# Patient Record
Sex: Female | Born: 1985 | Race: White | Hispanic: No | Marital: Married | Smoking: Never smoker
Health system: Southern US, Community
[De-identification: ages and names within clinical notes are randomized; demographics above are authoritative.]

## PROBLEM LIST (undated history)

## (undated) ENCOUNTER — Inpatient Hospital Stay (HOSPITAL_COMMUNITY): Payer: Self-pay

## (undated) DIAGNOSIS — E119 Type 2 diabetes mellitus without complications: Secondary | ICD-10-CM

## (undated) DIAGNOSIS — R112 Nausea with vomiting, unspecified: Secondary | ICD-10-CM

## (undated) DIAGNOSIS — Z86718 Personal history of other venous thrombosis and embolism: Secondary | ICD-10-CM

## (undated) DIAGNOSIS — R87629 Unspecified abnormal cytological findings in specimens from vagina: Secondary | ICD-10-CM

## (undated) DIAGNOSIS — Z8742 Personal history of other diseases of the female genital tract: Secondary | ICD-10-CM

## (undated) DIAGNOSIS — Z9889 Other specified postprocedural states: Secondary | ICD-10-CM

## (undated) DIAGNOSIS — K219 Gastro-esophageal reflux disease without esophagitis: Secondary | ICD-10-CM

## (undated) HISTORY — PX: COLPOSCOPY: SHX161

## (undated) HISTORY — PX: WISDOM TOOTH EXTRACTION: SHX21

## (undated) HISTORY — DX: Type 2 diabetes mellitus without complications: E11.9

---
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
## 2006-10-04 ENCOUNTER — Other Ambulatory Visit: Admission: RE | Admit: 2006-10-04 | Discharge: 2006-10-04 | Payer: Self-pay | Admitting: Family Medicine

## 2008-01-22 ENCOUNTER — Other Ambulatory Visit: Admission: RE | Admit: 2008-01-22 | Discharge: 2008-01-22 | Payer: Self-pay | Admitting: Family Medicine

## 2014-04-21 ENCOUNTER — Other Ambulatory Visit (HOSPITAL_COMMUNITY): Payer: Self-pay | Admitting: Obstetrics

## 2014-04-21 DIAGNOSIS — N971 Female infertility of tubal origin: Secondary | ICD-10-CM

## 2014-04-26 ENCOUNTER — Ambulatory Visit (HOSPITAL_COMMUNITY): Payer: Self-pay

## 2015-09-28 ENCOUNTER — Encounter (HOSPITAL_BASED_OUTPATIENT_CLINIC_OR_DEPARTMENT_OTHER): Payer: Self-pay

## 2015-09-28 ENCOUNTER — Emergency Department (HOSPITAL_BASED_OUTPATIENT_CLINIC_OR_DEPARTMENT_OTHER): Payer: BLUE CROSS/BLUE SHIELD

## 2015-09-28 ENCOUNTER — Emergency Department (HOSPITAL_BASED_OUTPATIENT_CLINIC_OR_DEPARTMENT_OTHER)
Admission: EM | Admit: 2015-09-28 | Discharge: 2015-09-28 | Disposition: A | Discharge status: Home / Self Care | Payer: BLUE CROSS/BLUE SHIELD | Attending: Emergency Medicine | Admitting: Emergency Medicine

## 2015-09-28 DIAGNOSIS — S8002XA Contusion of left knee, initial encounter: Secondary | ICD-10-CM | POA: Diagnosis not present

## 2015-09-28 DIAGNOSIS — Y998 Other external cause status: Secondary | ICD-10-CM | POA: Insufficient documentation

## 2015-09-28 DIAGNOSIS — W010XXA Fall on same level from slipping, tripping and stumbling without subsequent striking against object, initial encounter: Secondary | ICD-10-CM | POA: Insufficient documentation

## 2015-09-28 DIAGNOSIS — Y9302 Activity, running: Secondary | ICD-10-CM | POA: Insufficient documentation

## 2015-09-28 DIAGNOSIS — Y9289 Other specified places as the place of occurrence of the external cause: Secondary | ICD-10-CM | POA: Insufficient documentation

## 2015-09-28 DIAGNOSIS — S8992XA Unspecified injury of left lower leg, initial encounter: Secondary | ICD-10-CM | POA: Diagnosis present

## 2015-09-28 NOTE — Discharge Instructions (Signed)
Contusion °A contusion is a deep bruise. Contusions are the result of an injury that caused bleeding under the skin. The contusion may turn blue, purple, or yellow. Minor injuries will give you a painless contusion, but more severe contusions may stay painful and swollen for a few weeks.  °CAUSES  °A contusion is usually caused by a blow, trauma, or direct force to an area of the body. °SYMPTOMS  °· Swelling and redness of the injured area. °· Bruising of the injured area. °· Tenderness and soreness of the injured area. °· Pain. °DIAGNOSIS  °The diagnosis can be made by taking a history and physical exam. An X-ray, CT scan, or MRI may be needed to determine if there were any associated injuries, such as fractures. °TREATMENT  °Specific treatment will depend on what area of the body was injured. In general, the best treatment for a contusion is resting, icing, elevating, and applying cold compresses to the injured area. Over-the-counter medicines may also be recommended for pain control. Ask your caregiver what the best treatment is for your contusion. °HOME CARE INSTRUCTIONS  °· Put ice on the injured area. °¨ Put ice in a plastic bag. °¨ Place a towel between your skin and the bag. °¨ Leave the ice on for 15-20 minutes, 3-4 times a day, or as directed by your health care provider. °· Only take over-the-counter or prescription medicines for pain, discomfort, or fever as directed by your caregiver. Your caregiver may recommend avoiding anti-inflammatory medicines (aspirin, ibuprofen, and naproxen) for 48 hours because these medicines may increase bruising. °· Rest the injured area. °· If possible, elevate the injured area to reduce swelling. °SEEK IMMEDIATE MEDICAL CARE IF:  °· You have increased bruising or swelling. °· You have pain that is getting worse. °· Your swelling or pain is not relieved with medicines. °MAKE SURE YOU:  °· Understand these instructions. °· Will watch your condition. °· Will get help right  away if you are not doing well or get worse. °Document Released: 09/26/2005 Document Revised: 12/22/2013 Document Reviewed: 10/22/2011 °ExitCare® Patient Information ©2015 [employer]. This information is not intended to replace advice given to you by your health care provider. Make sure you discuss any questions you have with your health care provider. °Knee Pain °The knee is the complex joint between your thigh and your lower leg. It is made up of bones, tendons, ligaments, and cartilage. The bones that make up the knee are: °· The femur in the thigh. °· The tibia and fibula in the lower leg. °· The patella or kneecap riding in the groove on the lower femur. °CAUSES  °Knee pain is a common complaint with many causes. A few of these causes are: °· Injury, such as: °¨ A ruptured ligament or tendon injury. °¨ Torn cartilage. °· Medical conditions, such as: °¨ Gout °¨ Arthritis °¨ Infections °· Overuse, over training, or overdoing a physical activity. °Knee pain can be minor or severe. Knee pain can accompany debilitating injury. Minor knee problems often respond well to self-care measures or get well on their own. More serious injuries may need medical intervention or even surgery. °SYMPTOMS °The knee is complex. Symptoms of knee problems can vary widely. Some of the problems are: °· Pain with movement and weight bearing. °· Swelling and tenderness. °· Buckling of the knee. °· Inability to straighten or extend your knee. °· Your knee locks and you cannot straighten it. °· Warmth and redness with pain and fever. °· Deformity or dislocation   of the kneecap. °DIAGNOSIS  °Determining what is wrong may be very straight forward such as when there is an injury. It can also be challenging because of the complexity of the knee. Tests to make a diagnosis may include: °· Your caregiver taking a history and doing a physical exam. °· Routine X-rays can be used to rule out other problems. X-rays will not reveal a cartilage tear.  Some injuries of the knee can be diagnosed by: °¨ Arthroscopy a surgical technique by which a small video camera is inserted through tiny incisions on the sides of the knee. This procedure is used to examine and repair internal knee joint problems. Tiny instruments can be used during arthroscopy to repair the torn knee cartilage (meniscus). °¨ Arthrography is a radiology technique. A contrast liquid is directly injected into the knee joint. Internal structures of the knee joint then become visible on X-ray film. °¨ An MRI scan is a non X-ray radiology procedure in which magnetic fields and a computer produce two- or three-dimensional images of the inside of the knee. Cartilage tears are often visible using an MRI scanner. MRI scans have largely replaced arthrography in diagnosing cartilage tears of the knee. °· Blood work. °· Examination of the fluid that helps to lubricate the knee joint (synovial fluid). This is done by taking a sample out using a needle and a syringe. °TREATMENT °The treatment of knee problems depends on the cause. Some of these treatments are: °· Depending on the injury, proper casting, splinting, surgery, or physical therapy care will be needed. °· Give yourself adequate recovery time. Do not overuse your joints. If you begin to get sore during workout routines, back off. Slow down or do fewer repetitions. °· For repetitive activities such as cycling or running, maintain your strength and nutrition. °· Alternate muscle groups. For example, if you are a weight lifter, work the upper body on one day and the lower body the next. °· Either tight or weak muscles do not give the proper support for your knee. Tight or weak muscles do not absorb the stress placed on the knee joint. Keep the muscles surrounding the knee strong. °· Take care of mechanical problems. °¨ If you have flat feet, orthotics or special shoes may help. See your caregiver if you need help. °¨ Arch supports, sometimes with wedges  on the inner or outer aspect of the heel, can help. These can shift pressure away from the side of the knee most bothered by osteoarthritis. °¨ A brace called an "unloader" brace also may be used to help ease the pressure on the most arthritic side of the knee. °· If your caregiver has prescribed crutches, braces, wraps or ice, use as directed. The acronym for this is PRICE. This means protection, rest, ice, compression, and elevation. °· Nonsteroidal anti-inflammatory drugs (NSAIDs), can help relieve pain. But if taken immediately after an injury, they may actually increase swelling. Take NSAIDs with food in your stomach. Stop them if you develop stomach problems. Do not take these if you have a history of ulcers, stomach pain, or bleeding from the bowel. Do not take without your caregiver's approval if you have problems with fluid retention, heart failure, or kidney problems. °· For ongoing knee problems, physical therapy may be helpful. °· Glucosamine and chondroitin are over-the-counter dietary supplements. Both may help relieve the pain of osteoarthritis in the knee. These medicines are different from the usual anti-inflammatory drugs. Glucosamine may decrease the rate of cartilage destruction. °· Injections   of a corticosteroid drug into your knee joint may help reduce the symptoms of an arthritis flare-up. They may provide pain relief that lasts a few months. You may have to wait a few months between injections. The injections do have a small increased risk of infection, water retention, and elevated blood sugar levels. °· Hyaluronic acid injected into damaged joints may ease pain and provide lubrication. These injections may work by reducing inflammation. A series of shots may give relief for as long as 6 months. °· Topical painkillers. Applying certain ointments to your skin may help relieve the pain and stiffness of osteoarthritis. Ask your pharmacist for suggestions. Many over the-counter products are  approved for temporary relief of arthritis pain. °· In some countries, doctors often prescribe topical NSAIDs for relief of chronic conditions such as arthritis and tendinitis. A review of treatment with NSAID creams found that they worked as well as oral medications but without the serious side effects. °PREVENTION °· Maintain a healthy weight. Extra pounds put more strain on your joints. °· Get strong, stay limber. Weak muscles are a common cause of knee injuries. Stretching is important. Include flexibility exercises in your workouts. °· Be smart about exercise. If you have osteoarthritis, chronic knee pain or recurring injuries, you may need to change the way you exercise. This does not mean you have to stop being active. If your knees ache after jogging or playing basketball, consider switching to swimming, water aerobics, or other low-impact activities, at least for a few days a week. Sometimes limiting high-impact activities will provide relief. °· Make sure your shoes fit well. Choose footwear that is right for your sport. °· Protect your knees. Use the proper gear for knee-sensitive activities. Use kneepads when playing volleyball or laying carpet. Buckle your seat belt every time you drive. Most shattered kneecaps occur in car accidents. °· Rest when you are tired. °SEEK MEDICAL CARE IF:  °You have knee pain that is continual and does not seem to be getting better.  °SEEK IMMEDIATE MEDICAL CARE IF:  °Your knee joint feels hot to the touch and you have a high fever. °MAKE SURE YOU:  °· Understand these instructions. °· Will watch your condition. °· Will get help right away if you are not doing well or get worse. °Document Released: 10/14/2007 Document Revised: 03/10/2012 Document Reviewed: 10/14/2007 °ExitCare® Patient Information ©2015 [employer]. This information is not intended to replace advice given to you by your health care provider. Make sure you discuss any questions you have with your health  care provider. ° °

## 2015-09-28 NOTE — ED Provider Notes (Signed)
CSN: [id number]     Arrival date & time 09/28/15  2104 History  By signing my name below, I, Maria Prince, attest that this documentation has been prepared under the direction and in the presence of Arby Barrette, MD. Electronically Signed: Soijett Prince, ED Scribe. 09/28/2015. 10:33 PM.   Chief Complaint  Patient presents with  . Knee Injury      The history is provided by the patient. No language interpreter was used.    Maria Prince is a 29 y.o. female  who presents to the Emergency Department complaining of left knee injury onset PTA. She reports that she was running when she slipped and fell onto hardwood floor and landed directly on her left knee. She reports that she is able to walk but not without pain to the left knee. Pt is having associated symptoms of redness to left knee. She notes that she has tried ibuprofen with no relief of her symptoms. She denies color change, wound, rash, joint swelling, and any other symptoms. She notes that she does have an orthopedist.    History reviewed. No pertinent past medical history. History reviewed. No pertinent past surgical history. No family history on file. Social History  Substance Use Topics  . Smoking status: None  . Smokeless tobacco: None  . Alcohol Use: None   OB History    No data available     Review of Systems  A complete 10 system review of systems was obtained and all systems are negative except as noted in the HPI and PMH.    Allergies  Neomycin and Tetracyclines & related  Home Medications   Prior to Admission medications   Not on File   BP 119/83 mmHg  Pulse 80  Temp(Src) 98.3 F (36.8 C) (Oral)  Resp 16  Ht  (1.778 m)  Wt 157 lb (71.215 kg)  BMI 22.53 kg/m2  SpO2 98%  LMP 09/02/2015 Physical Exam  Constitutional: She is oriented to person, place, and time. She appears well-developed and well-nourished.  HENT:  Head: Normocephalic and atraumatic.  Eyes: EOM are normal.  Pulmonary/Chest:  Effort normal.  Musculoskeletal:  Left knee with mild erythema over her patella. No fluctuance. No gross joint effusion. Pain with direct palpation. Range of motion intact. Patient ambulates with antalgic gait. Lower extremity is neurovascularly intact.  Neurological: She is alert and oriented to person, place, and time.  Psychiatric: She has a normal mood and affect.    ED Course  Procedures (including critical care time) DIAGNOSTIC STUDIES: Oxygen Saturation is 98% on RA, nl by my interpretation.    COORDINATION OF CARE: 10:31 PM Discussed treatment plan with pt at bedside which includes left knee xray, ace wrap, knee brace, crutches, RICE, ibuprofen, and f/u with orthopedics PRN and pt agreed to plan.    Labs Review Labs Reviewed - No data to display  Imaging Review Dg Knee Complete 4 Views Left  09/28/2015   CLINICAL DATA:  Status post fall today with pain in the left knee.  EXAM: LEFT KNEE - COMPLETE 4+ VIEW  COMPARISON:  None.  FINDINGS: There is no evidence of fracture, dislocation, or joint effusion. There is no evidence of arthropathy or other focal bone abnormality. Soft tissues are unremarkable.  IMPRESSION: Negative.   Electronically Signed   By: Sherian Rein M.D.   On: 09/28/2015 21:53   I have personally reviewed and evaluated these images as part of my medical decision-making.   EKG Interpretation None  MDM   Final diagnoses:  Knee contusion, left, initial encounter    No fractures identified on x-ray. Plan will be for immobilization with icing and elevation and ibuprofen for pain. Patient is counseled on follow-up with orthopedics.   Arby Barrette, MD 10/13/15 [phone]

## 2015-09-28 NOTE — ED Notes (Signed)
Patient transported to X-ray 

## 2015-09-28 NOTE — ED Notes (Addendum)
Pt was running and slipped and fell onto hardwood floor and landed on her left knee.  Pt able to ambulate with limping, redness to knee cap.  Pt took ibuprofen prior to arrival.

## 2015-10-16 ENCOUNTER — Encounter (HOSPITAL_BASED_OUTPATIENT_CLINIC_OR_DEPARTMENT_OTHER): Payer: Self-pay | Admitting: *Deleted

## 2015-10-16 ENCOUNTER — Emergency Department (HOSPITAL_BASED_OUTPATIENT_CLINIC_OR_DEPARTMENT_OTHER)
Admission: EM | Admit: 2015-10-16 | Discharge: 2015-10-16 | Disposition: A | Discharge status: Home / Self Care | Payer: BLUE CROSS/BLUE SHIELD | Attending: Emergency Medicine | Admitting: Emergency Medicine

## 2015-10-16 DIAGNOSIS — M5442 Lumbago with sciatica, left side: Secondary | ICD-10-CM

## 2015-10-16 DIAGNOSIS — M5440 Lumbago with sciatica, unspecified side: Secondary | ICD-10-CM | POA: Diagnosis not present

## 2015-10-16 DIAGNOSIS — M545 Low back pain: Secondary | ICD-10-CM | POA: Diagnosis present

## 2015-10-16 DIAGNOSIS — Z79899 Other long term (current) drug therapy: Secondary | ICD-10-CM | POA: Insufficient documentation

## 2015-10-16 DIAGNOSIS — Z86718 Personal history of other venous thrombosis and embolism: Secondary | ICD-10-CM | POA: Insufficient documentation

## 2015-10-16 DIAGNOSIS — Z3202 Encounter for pregnancy test, result negative: Secondary | ICD-10-CM | POA: Insufficient documentation

## 2015-10-16 DIAGNOSIS — M5441 Lumbago with sciatica, right side: Secondary | ICD-10-CM

## 2015-10-16 HISTORY — DX: Personal history of other venous thrombosis and embolism: Z86.718

## 2015-10-16 LAB — URINALYSIS, ROUTINE W REFLEX MICROSCOPIC
BILIRUBIN URINE: NEGATIVE
Glucose, UA: NEGATIVE mg/dL
Hgb urine dipstick: NEGATIVE
KETONES UR: NEGATIVE mg/dL
Leukocytes, UA: NEGATIVE
NITRITE: NEGATIVE
PH: 5.5 (ref 5.0–8.0)
Protein, ur: NEGATIVE mg/dL
Specific Gravity, Urine: 1.003 — ABNORMAL LOW (ref 1.005–1.030)
Urobilinogen, UA: 0.2 mg/dL (ref 0.0–1.0)

## 2015-10-16 LAB — PREGNANCY, URINE: Preg Test, Ur: NEGATIVE

## 2015-10-16 MED ORDER — HYDROCODONE-ACETAMINOPHEN 5-325 MG PO TABS
1.0000 | ORAL_TABLET | Freq: Once | ORAL | Status: AC
  Administered 2015-10-16: 1 via ORAL
  Filled 2015-10-16: qty 1

## 2015-10-16 MED ORDER — HYDROCODONE-ACETAMINOPHEN 5-325 MG PO TABS: ORAL_TABLET | ORAL | Status: DC

## 2015-10-16 NOTE — Discharge Instructions (Signed)
Please follow with your sports medicine doctor for discussion of physical therapy  Take vicodin for breakthrough pain, do not drink alcohol, drive, care for children or do other critical tasks while taking vicodin.  Please follow with your primary care doctor in the next 2 days for a check-up. They must obtain records for further management.   Do not hesitate to return to the Emergency Department for any new, worsening or concerning symptoms.    Sciatica Sciatica is pain, weakness, numbness, or tingling along the path of the sciatic nerve. The nerve starts in the lower back and runs down the back of each leg. The nerve controls the muscles in the lower leg and in the back of the knee, while also providing sensation to the back of the thigh, lower leg, and the sole of your foot. Sciatica is a symptom of another medical condition. For instance, nerve damage or certain conditions, such as a herniated disk or bone spur on the spine, pinch or put pressure on the sciatic nerve. This causes the pain, weakness, or other sensations normally associated with sciatica. Generally, sciatica only affects one side of the body. CAUSES   Herniated or slipped disc.  Degenerative disk disease.  A pain disorder involving the narrow muscle in the buttocks (piriformis syndrome).  Pelvic injury or fracture.  Pregnancy.  Tumor (rare). SYMPTOMS  Symptoms can vary from mild to very severe. The symptoms usually travel from the low back to the buttocks and down the back of the leg. Symptoms can include:  Mild tingling or dull aches in the lower back, leg, or hip.  Numbness in the back of the calf or sole of the foot.  Burning sensations in the lower back, leg, or hip.  Sharp pains in the lower back, leg, or hip.  Leg weakness.  Severe back pain inhibiting movement. These symptoms may get worse with coughing, sneezing, laughing, or prolonged sitting or standing. Also, being overweight may worsen  symptoms. DIAGNOSIS  Your caregiver will perform a physical exam to look for common symptoms of sciatica. He or she may ask you to do certain movements or activities that would trigger sciatic nerve pain. Other tests may be performed to find the cause of the sciatica. These may include:  Blood tests.  X-rays.  Imaging tests, such as an MRI or CT scan. TREATMENT  Treatment is directed at the cause of the sciatic pain. Sometimes, treatment is not necessary and the pain and discomfort goes away on its own. If treatment is needed, your caregiver may suggest:  Over-the-counter medicines to relieve pain.  Prescription medicines, such as anti-inflammatory medicine, muscle relaxants, or narcotics.  Applying heat or ice to the painful area.  Steroid injections to lessen pain, irritation, and inflammation around the nerve.  Reducing activity during periods of pain.  Exercising and stretching to strengthen your abdomen and improve flexibility of your spine. Your caregiver may suggest losing weight if the extra weight makes the back pain worse.  Physical therapy.  Surgery to eliminate what is pressing or pinching the nerve, such as a bone spur or part of a herniated disk. HOME CARE INSTRUCTIONS   Only take over-the-counter or prescription medicines for pain or discomfort as directed by your caregiver.  Apply ice to the affected area for 20 minutes, 3-4 times a day for the first 48-72 hours. Then try heat in the same way.  Exercise, stretch, or perform your usual activities if these do not aggravate your pain.  Attend physical  therapy sessions as directed by your caregiver.  Keep all follow-up appointments as directed by your caregiver.  Do not wear high heels or shoes that do not provide proper support.  Check your mattress to see if it is too soft. A firm mattress may lessen your pain and discomfort. SEEK IMMEDIATE MEDICAL CARE IF:   You lose control of your bowel or bladder  (incontinence).  You have increasing weakness in the lower back, pelvis, buttocks, or legs.  You have redness or swelling of your back.  You have a burning sensation when you urinate.  You have pain that gets worse when you lie down or awakens you at night.  Your pain is worse than you have experienced in the past.  Your pain is lasting longer than 4 weeks.  You are suddenly losing weight without reason. MAKE SURE YOU:  Understand these instructions.  Will watch your condition.  Will get help right away if you are not doing well or get worse.   This information is not intended to replace advice given to you by your health care provider. Make sure you discuss any questions you have with your health care provider.   Document Released: 12/11/2001 Document Revised: 09/07/2015 Document Reviewed: 04/27/2012 Elsevier Interactive Patient Education [employer]

## 2015-10-16 NOTE — ED Notes (Signed)
Pt reports that around 0900 today she bent over and felt a pain in her lower back. Denies radiation to legs, genitourinary symptoms.

## 2015-10-16 NOTE — ED Notes (Signed)
Care assumed at time of d/c, pt not seen by this RN, pt d/c'd by other RN.

## 2015-10-16 NOTE — ED Provider Notes (Signed)
CSN: [id number]     Arrival date & time 10/16/15  1833 History   First MD Initiated Contact with Patient 10/16/15 1836     Chief Complaint  Patient presents with  . Back Pain     (Consider location/radiation/quality/duration/timing/severity/associated sxs/prior Treatment) HPI  Blood pressure 104/72, pulse 79, temperature 98.1 F (36.7 C), temperature source Oral, resp. rate 18, height  (1.778 m), weight 159 lb (72.122 kg), last menstrual period 10/01/2015, SpO2 98 %.  Maria Prince is a 29 y.o. female complaining of moderate, 9 out of 10 bilateral low back pain occurring this morning after patient bent over. States pain is exacerbated by sitting and palpation. Patient took tramadol which was prescribed for a meniscus injury several weeks ago with little relief. Patient denies abdominal pain, dysuria, hematuria, similar prior episodes. Patient has seen a sports medicine doctor for the meniscus tear and states that that has been improving without intervention. Of note, patient is undergoing fertility treatment and needs to avoid any NSAIDs. Denies fever, chills, change in bowel or bladder habits, h/o IDVU or cancer, numbness or weakness.   Past Medical History  Diagnosis Date  . 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 No pertinent past surgical history. No family history on file. Social History  Substance Use Topics  . Smoking status: Never Smoker   . Smokeless tobacco: Never Used  . Alcohol Use: Yes     Comment: occasionally throughout the month   OB History    No data available     Review of Systems  10 systems reviewed and found to be negative, except as noted in the HPI.   Allergies  Neomycin; Tetracyclines & related; Desonide; Fluocinonide; Hydrocortisone butyrate; and Triamcinolone  Home Medications   Prior to Admission medications   Medication Sig Start Date End Date Taking? Authorizing Provider  Prenatal Vit-Fe Fumarate-FA (PRENATAL  MULTIVITAMIN) TABS tablet Take 1 tablet by mouth daily at 12 noon.   Yes Historical Provider, MD  HYDROcodone-acetaminophen (NORCO/VICODIN) 5-325 MG tablet Take 1-2 tablets by mouth every 6 hours as needed for pain and/or cough. 10/16/15   Allora Bains, PA-C   BP 104/72 mmHg  Pulse 79  Temp(Src) 98.1 F (36.7 C) (Oral)  Resp 18  Ht  (1.778 m)  Wt 159 lb (72.122 kg)  BMI 22.81 kg/m2  SpO2 98%  LMP 10/01/2015 (Exact Date) Physical Exam  Constitutional: She appears well-developed and well-nourished.  HENT:  Head: Normocephalic.  Eyes: Conjunctivae are normal.  Neck: Normal range of motion.  Cardiovascular: Normal rate, regular rhythm and intact distal pulses.   Pulmonary/Chest: Effort normal.  Abdominal: Soft. There is no tenderness.  Neurological: She is alert.  No point tenderness to percussion of lumbar spinal processes.  No TTP or paraspinal muscular spasm. Strength is 5 out of 5 to bilateral lower extremities at hip and knee; extensor hallucis longus 5 out of 5. Ankle strength 5 out of 5, no clonus, neurovascularly intact. No saddle anaesthesia. Patellar reflexes are 2+ bilaterally.    Patient ambulates with a coordinated in nonantalgic gait. Straight leg raise is positive at 60 bilaterally.   Psychiatric: She has a normal mood and affect.  Nursing note and vitals reviewed.   ED Course  Procedures (including critical care time) Labs Review Labs Reviewed  URINALYSIS, ROUTINE W REFLEX MICROSCOPIC (NOT AT [facility])  PREGNANCY, URINE    Imaging Review No results found. I have personally reviewed and evaluated these images  and lab results as part of my medical decision-making.   EKG Interpretation None      MDM   Final diagnoses:  Bilateral low back pain with sciatica, sciatica laterality unspecified    Filed Vitals:   10/16/15 1838  BP: 104/72  Pulse: 79  Temp: 98.1 F (36.7 C)  TempSrc: Oral  Resp: 18  Height: 5\' 10"  (1.778 m)  Weight: 159 lb  (72.122 kg)  SpO2: 98%    Medications  HYDROcodone-acetaminophen (NORCO/VICODIN) 5-325 MG per tablet 1 tablet (not administered)    Maria Prince is 29 y.o. female presenting with low back pain onset this morning. I think this may be related to her recent knee injury and a issue with biomechanical alignment. Pain control choices are limited as patient is actively trying to conceive. Taking her off of tramadol will start her on Vicodin. Have advised her to follow closely with her sports medicine physician to discuss physical therapy.  Evaluation does not show pathology that would require ongoing emergent intervention or inpatient treatment. Pt is hemodynamically stable and mentating appropriately. Discussed findings and plan with patient/guardian, who agrees with care plan. All questions answered. Return precautions discussed and outpatient follow up given.   New Prescriptions   HYDROCODONE-ACETAMINOPHEN (NORCO/VICODIN) 5-325 MG TABLET    Take 1-2 tablets by mouth every 6 hours as needed for pain and/or cough.        Wynetta Emeryicole Pritika Alvarez, PA-C 10/16/15 1913  Glynn OctaveStephen Rancour, MD 10/16/15 [phone]

## 2016-04-19 DIAGNOSIS — O09811 Supervision of pregnancy resulting from assisted reproductive technology, first trimester: Secondary | ICD-10-CM | POA: Diagnosis not present

## 2016-04-19 DIAGNOSIS — Z36 Encounter for antenatal screening of mother: Secondary | ICD-10-CM | POA: Diagnosis not present

## 2016-04-19 DIAGNOSIS — Z3491 Encounter for supervision of normal pregnancy, unspecified, first trimester: Secondary | ICD-10-CM | POA: Diagnosis not present

## 2016-04-19 DIAGNOSIS — Z113 Encounter for screening for infections with a predominantly sexual mode of transmission: Secondary | ICD-10-CM | POA: Diagnosis not present

## 2016-04-19 LAB — OB RESULTS CONSOLE ABO/RH: RH TYPE: POSITIVE

## 2016-04-19 LAB — OB RESULTS CONSOLE HIV ANTIBODY (ROUTINE TESTING): HIV: NONREACTIVE

## 2016-04-19 LAB — OB RESULTS CONSOLE RUBELLA ANTIBODY, IGM: RUBELLA: IMMUNE

## 2016-04-19 LAB — OB RESULTS CONSOLE RPR: RPR: NONREACTIVE

## 2016-04-19 LAB — OB RESULTS CONSOLE HEPATITIS B SURFACE ANTIGEN: HEP B S AG: NEGATIVE

## 2016-04-19 LAB — OB RESULTS CONSOLE GC/CHLAMYDIA
CHLAMYDIA, DNA PROBE: NEGATIVE
Gonorrhea: NEGATIVE

## 2016-04-30 DIAGNOSIS — Z36 Encounter for antenatal screening of mother: Secondary | ICD-10-CM | POA: Diagnosis not present

## 2016-05-29 DIAGNOSIS — Z36 Encounter for antenatal screening of mother: Secondary | ICD-10-CM | POA: Diagnosis not present

## 2016-05-29 DIAGNOSIS — Z3A16 16 weeks gestation of pregnancy: Secondary | ICD-10-CM | POA: Diagnosis not present

## 2016-05-29 DIAGNOSIS — O30042 Twin pregnancy, dichorionic/diamniotic, second trimester: Secondary | ICD-10-CM | POA: Diagnosis not present

## 2016-06-18 DIAGNOSIS — O30042 Twin pregnancy, dichorionic/diamniotic, second trimester: Secondary | ICD-10-CM | POA: Diagnosis not present

## 2016-06-18 DIAGNOSIS — Z36 Encounter for antenatal screening of mother: Secondary | ICD-10-CM | POA: Diagnosis not present

## 2016-07-12 DIAGNOSIS — R42 Dizziness and giddiness: Secondary | ICD-10-CM | POA: Diagnosis not present

## 2016-07-12 DIAGNOSIS — O30042 Twin pregnancy, dichorionic/diamniotic, second trimester: Secondary | ICD-10-CM | POA: Diagnosis not present

## 2016-07-12 DIAGNOSIS — Z3A22 22 weeks gestation of pregnancy: Secondary | ICD-10-CM | POA: Diagnosis not present

## 2016-08-09 DIAGNOSIS — Z3A26 26 weeks gestation of pregnancy: Secondary | ICD-10-CM | POA: Diagnosis not present

## 2016-08-09 DIAGNOSIS — O30042 Twin pregnancy, dichorionic/diamniotic, second trimester: Secondary | ICD-10-CM | POA: Diagnosis not present

## 2016-08-09 DIAGNOSIS — Z36 Encounter for antenatal screening of mother: Secondary | ICD-10-CM | POA: Diagnosis not present

## 2016-08-16 DIAGNOSIS — O99810 Abnormal glucose complicating pregnancy: Secondary | ICD-10-CM | POA: Diagnosis not present

## 2016-08-16 DIAGNOSIS — Z3A27 27 weeks gestation of pregnancy: Secondary | ICD-10-CM | POA: Diagnosis not present

## 2016-08-20 ENCOUNTER — Encounter: Payer: BLUE CROSS/BLUE SHIELD | Attending: Obstetrics | Admitting: Skilled Nursing Facility1

## 2016-08-20 DIAGNOSIS — O99810 Abnormal glucose complicating pregnancy: Secondary | ICD-10-CM | POA: Insufficient documentation

## 2016-08-20 DIAGNOSIS — Z713 Dietary counseling and surveillance: Secondary | ICD-10-CM | POA: Insufficient documentation

## 2016-08-20 DIAGNOSIS — O24410 Gestational diabetes mellitus in pregnancy, diet controlled: Secondary | ICD-10-CM

## 2016-08-22 ENCOUNTER — Encounter: Payer: Self-pay | Admitting: Skilled Nursing Facility1

## 2016-08-22 NOTE — Progress Notes (Signed)
  Patient was seen on 08/20/2016 for Gestational Diabetes self-management class at the [facility]. The following learning objectives were met by the patient during this course:   States the definition of Gestational Diabetes  States why dietary management is important in controlling blood glucose  Describes the effects each nutrient has on blood glucose levels  Demonstrates ability to create a balanced meal plan  Demonstrates carbohydrate counting   States when to check blood glucose levels involving a total of 4 separate occurences in a day  Demonstrates proper blood glucose monitoring techniques  States the effect of stress and exercise on blood glucose levels  States the importance of limiting caffeine and abstaining from alcohol and smoking  Demonstrates the knowledge the glucometer provided in class may not be covered by their insurance and to call their insurance provider immediately after class to know which glucometer their insurance provider does cover as well as calling their physician the next day for a prescription to the glucometer their insurance does cover (if the one provided is not) as well as the lancets and strips for that meter.  Blood glucose monitor given: contour next Lot # [id number] Exp: 02/27/2017 Blood glucose reading: 87  Patient instructed to monitor glucose levels: FBS: 60 - <90 1 hour: <140 2 hour: <120  *Patient received handouts:  Nutrition Diabetes and Pregnancy  Carbohydrate Counting List  Patient will be seen for follow-up as needed.

## 2016-08-23 DIAGNOSIS — Z36 Encounter for antenatal screening of mother: Secondary | ICD-10-CM | POA: Diagnosis not present

## 2016-08-23 DIAGNOSIS — Z23 Encounter for immunization: Secondary | ICD-10-CM | POA: Diagnosis not present

## 2016-08-24 ENCOUNTER — Encounter: Payer: Self-pay | Admitting: Skilled Nursing Facility1

## 2016-08-29 ENCOUNTER — Encounter: Payer: Self-pay | Admitting: Obstetrics

## 2016-09-06 DIAGNOSIS — Z3A30 30 weeks gestation of pregnancy: Secondary | ICD-10-CM | POA: Diagnosis not present

## 2016-09-06 DIAGNOSIS — O30043 Twin pregnancy, dichorionic/diamniotic, third trimester: Secondary | ICD-10-CM | POA: Diagnosis not present

## 2016-09-20 DIAGNOSIS — J029 Acute pharyngitis, unspecified: Secondary | ICD-10-CM | POA: Diagnosis not present

## 2016-10-05 DIAGNOSIS — O30043 Twin pregnancy, dichorionic/diamniotic, third trimester: Secondary | ICD-10-CM | POA: Diagnosis not present

## 2016-10-05 DIAGNOSIS — Z3A34 34 weeks gestation of pregnancy: Secondary | ICD-10-CM | POA: Diagnosis not present

## 2016-10-05 DIAGNOSIS — Z23 Encounter for immunization: Secondary | ICD-10-CM | POA: Diagnosis not present

## 2016-10-16 ENCOUNTER — Inpatient Hospital Stay (HOSPITAL_COMMUNITY): Payer: BLUE CROSS/BLUE SHIELD | Admitting: Anesthesiology

## 2016-10-16 ENCOUNTER — Encounter (HOSPITAL_COMMUNITY)
Admission: AD | Disposition: A | Discharge status: Home / Self Care | Payer: Self-pay | Source: Ambulatory Visit | Attending: Obstetrics and Gynecology

## 2016-10-16 ENCOUNTER — Encounter (HOSPITAL_COMMUNITY): Payer: Self-pay

## 2016-10-16 ENCOUNTER — Encounter (HOSPITAL_COMMUNITY): Payer: Self-pay | Admitting: Anesthesiology

## 2016-10-16 ENCOUNTER — Inpatient Hospital Stay (HOSPITAL_COMMUNITY)
Admission: AD | Admit: 2016-10-16 | Discharge: 2016-10-23 | DRG: 765 | Disposition: A | Discharge status: Home / Self Care | Payer: BLUE CROSS/BLUE SHIELD | Source: Ambulatory Visit | Attending: Obstetrics and Gynecology | Admitting: Obstetrics and Gynecology

## 2016-10-16 DIAGNOSIS — O24420 Gestational diabetes mellitus in childbirth, diet controlled: Secondary | ICD-10-CM | POA: Diagnosis present

## 2016-10-16 DIAGNOSIS — O1494 Unspecified pre-eclampsia, complicating childbirth: Secondary | ICD-10-CM | POA: Diagnosis not present

## 2016-10-16 DIAGNOSIS — O1415 Severe pre-eclampsia, complicating the puerperium: Secondary | ICD-10-CM | POA: Diagnosis not present

## 2016-10-16 DIAGNOSIS — Z3A36 36 weeks gestation of pregnancy: Secondary | ICD-10-CM

## 2016-10-16 DIAGNOSIS — O99345 Other mental disorders complicating the puerperium: Secondary | ICD-10-CM | POA: Diagnosis present

## 2016-10-16 DIAGNOSIS — O42013 Preterm premature rupture of membranes, onset of labor within 24 hours of rupture, third trimester: Principal | ICD-10-CM | POA: Diagnosis present

## 2016-10-16 DIAGNOSIS — O1414 Severe pre-eclampsia complicating childbirth: Secondary | ICD-10-CM | POA: Diagnosis not present

## 2016-10-16 DIAGNOSIS — D62 Acute posthemorrhagic anemia: Secondary | ICD-10-CM | POA: Diagnosis not present

## 2016-10-16 DIAGNOSIS — Z23 Encounter for immunization: Secondary | ICD-10-CM | POA: Diagnosis not present

## 2016-10-16 DIAGNOSIS — O30043 Twin pregnancy, dichorionic/diamniotic, third trimester: Secondary | ICD-10-CM | POA: Diagnosis present

## 2016-10-16 DIAGNOSIS — O9081 Anemia of the puerperium: Secondary | ICD-10-CM | POA: Diagnosis not present

## 2016-10-16 DIAGNOSIS — O328XX2 Maternal care for other malpresentation of fetus, fetus 2: Secondary | ICD-10-CM | POA: Diagnosis present

## 2016-10-16 DIAGNOSIS — F419 Anxiety disorder, unspecified: Secondary | ICD-10-CM | POA: Diagnosis present

## 2016-10-16 DIAGNOSIS — O321XX2 Maternal care for breech presentation, fetus 2: Secondary | ICD-10-CM | POA: Diagnosis not present

## 2016-10-16 DIAGNOSIS — O9912 Other diseases of the blood and blood-forming organs and certain disorders involving the immune mechanism complicating childbirth: Secondary | ICD-10-CM | POA: Diagnosis present

## 2016-10-16 DIAGNOSIS — Z6827 Body mass index (BMI) 27.0-27.9, adult: Secondary | ICD-10-CM

## 2016-10-16 DIAGNOSIS — O1495 Unspecified pre-eclampsia, complicating the puerperium: Secondary | ICD-10-CM | POA: Diagnosis not present

## 2016-10-16 DIAGNOSIS — O30049 Twin pregnancy, dichorionic/diamniotic, unspecified trimester: Secondary | ICD-10-CM | POA: Diagnosis not present

## 2016-10-16 DIAGNOSIS — O4290 Premature rupture of membranes, unspecified as to length of time between rupture and onset of labor, unspecified weeks of gestation: Secondary | ICD-10-CM | POA: Diagnosis present

## 2016-10-16 DIAGNOSIS — D696 Thrombocytopenia, unspecified: Secondary | ICD-10-CM | POA: Diagnosis present

## 2016-10-16 LAB — COMPREHENSIVE METABOLIC PANEL
ALBUMIN: 1.9 g/dL — AB (ref 3.5–5.0)
ALT: 14 U/L (ref 14–54)
ALT: 13 U/L — AB (ref 14–54)
ANION GAP: 11 (ref 5–15)
AST: 30 U/L (ref 15–41)
AST: 30 U/L (ref 15–41)
Albumin: 2.5 g/dL — ABNORMAL LOW (ref 3.5–5.0)
Alkaline Phosphatase: 209 U/L — ABNORMAL HIGH (ref 38–126)
Alkaline Phosphatase: 150 U/L — ABNORMAL HIGH (ref 38–126)
Anion gap: 8 (ref 5–15)
BILIRUBIN TOTAL: 0.6 mg/dL (ref 0.3–1.2)
BILIRUBIN TOTAL: 0.4 mg/dL (ref 0.3–1.2)
BUN: 26 mg/dL — AB (ref 6–20)
BUN: 24 mg/dL — AB (ref 6–20)
CHLORIDE: 107 mmol/L (ref 101–111)
CHLORIDE: 107 mmol/L (ref 101–111)
CO2: 17 mmol/L — ABNORMAL LOW (ref 22–32)
CO2: 20 mmol/L — ABNORMAL LOW (ref 22–32)
CREATININE: 0.96 mg/dL (ref 0.44–1.00)
Calcium: 9.2 mg/dL (ref 8.9–10.3)
Calcium: 8.3 mg/dL — ABNORMAL LOW (ref 8.9–10.3)
Creatinine, Ser: 0.91 mg/dL (ref 0.44–1.00)
GFR calc Af Amer: >60 mL/min (ref >60)
GLUCOSE: 94 mg/dL (ref 65–99)
Glucose, Bld: 70 mg/dL (ref 65–99)
POTASSIUM: 4.2 mmol/L (ref 3.5–5.1)
Potassium: 5.1 mmol/L (ref 3.5–5.1)
Sodium: 135 mmol/L (ref 135–145)
Sodium: 135 mmol/L (ref 135–145)
TOTAL PROTEIN: 5.1 g/dL — AB (ref 6.5–8.1)
TOTAL PROTEIN: 4.2 g/dL — AB (ref 6.5–8.1)

## 2016-10-16 LAB — CBC
HEMATOCRIT: 34.4 % — AB (ref 36.0–46.0)
HEMATOCRIT: 26 % — AB (ref 36.0–46.0)
HEMATOCRIT: 22.3 % — AB (ref 36.0–46.0)
HEMOGLOBIN: 7.5 g/dL — AB (ref 12.0–15.0)
Hemoglobin: 11.7 g/dL — ABNORMAL LOW (ref 12.0–15.0)
Hemoglobin: 8.6 g/dL — ABNORMAL LOW (ref 12.0–15.0)
MCH: 29.5 pg (ref 26.0–34.0)
MCH: 29.1 pg (ref 26.0–34.0)
MCH: 29 pg (ref 26.0–34.0)
MCHC: 34 g/dL (ref 30.0–36.0)
MCHC: 33.1 g/dL (ref 30.0–36.0)
MCHC: 33.6 g/dL (ref 30.0–36.0)
MCV: 86.9 fL (ref 78.0–100.0)
MCV: 87.8 fL (ref 78.0–100.0)
MCV: 86.1 fL (ref 78.0–100.0)
PLATELETS: 107 10*3/uL — AB (ref 150–400)
Platelets: 90 10*3/uL — ABNORMAL LOW (ref 150–400)
Platelets: 102 10*3/uL — ABNORMAL LOW (ref 150–400)
RBC: 3.96 MIL/uL (ref 3.87–5.11)
RBC: 2.96 MIL/uL — AB (ref 3.87–5.11)
RBC: 2.59 MIL/uL — ABNORMAL LOW (ref 3.87–5.11)
RDW: 14.6 % (ref 11.5–15.5)
RDW: 14.3 % (ref 11.5–15.5)
RDW: 14.7 % (ref 11.5–15.5)
WBC: 9 10*3/uL (ref 4.0–10.5)
WBC: 16.9 10*3/uL — AB (ref 4.0–10.5)
WBC: 14.9 10*3/uL — ABNORMAL HIGH (ref 4.0–10.5)

## 2016-10-16 LAB — PREPARE RBC (CROSSMATCH)

## 2016-10-16 LAB — FIBRINOGEN: Fibrinogen: 249 mg/dL (ref 210–475)

## 2016-10-16 LAB — RPR: RPR: NONREACTIVE

## 2016-10-16 LAB — PROTIME-INR
INR: 1.16
PROTHROMBIN TIME: 14.8 s (ref 11.4–15.2)

## 2016-10-16 LAB — LACTATE DEHYDROGENASE: LDH: 183 U/L (ref 98–192)

## 2016-10-16 LAB — PROTEIN / CREATININE RATIO, URINE
CREATININE, URINE: 109 mg/dL
PROTEIN CREATININE RATIO: 1.61 mg/mg{creat} — AB (ref 0.00–0.15)
TOTAL PROTEIN, URINE: 176 mg/dL

## 2016-10-16 LAB — MAGNESIUM: Magnesium: 5.1 mg/dL — ABNORMAL HIGH (ref 1.7–2.4)

## 2016-10-16 LAB — OB RESULTS CONSOLE GBS: STREP GROUP B AG: NEGATIVE

## 2016-10-16 LAB — APTT: APTT: 31 s (ref 24–36)

## 2016-10-16 LAB — ABO/RH: ABO/RH(D): O POS

## 2016-10-16 LAB — GROUP B STREP BY PCR: GROUP B STREP BY PCR: NEGATIVE

## 2016-10-16 SURGERY — Surgical Case
Anesthesia: Regional

## 2016-10-16 MED ORDER — MORPHINE SULFATE (PF) 0.5 MG/ML IJ SOLN
INTRAMUSCULAR | Status: DC | PRN
  Administered 2016-10-16: 3 mg via EPIDURAL

## 2016-10-16 MED ORDER — FENTANYL CITRATE (PF) 100 MCG/2ML IJ SOLN: 100.0000 ug | INTRAMUSCULAR | Status: DC | PRN

## 2016-10-16 MED ORDER — MENTHOL 3 MG MT LOZG: 1.0000 | LOZENGE | OROMUCOSAL | Status: DC | PRN

## 2016-10-16 MED ORDER — PHENYLEPHRINE HCL 10 MG/ML IJ SOLN
INTRAMUSCULAR | Status: DC | PRN
  Administered 2016-10-16: 80 ug via INTRAVENOUS
  Administered 2016-10-16: 40 ug via INTRAVENOUS

## 2016-10-16 MED ORDER — NALBUPHINE HCL 10 MG/ML IJ SOLN: 5.0000 mg | Freq: Once | INTRAMUSCULAR | Status: DC | PRN

## 2016-10-16 MED ORDER — ONDANSETRON HCL 4 MG/2ML IJ SOLN
4.0000 mg | Freq: Once | INTRAMUSCULAR | Status: AC
  Administered 2016-10-16: 4 mg via INTRAVENOUS
  Filled 2016-10-16: qty 2

## 2016-10-16 MED ORDER — DIBUCAINE 1 % RE OINT: 1.0000 "application " | TOPICAL_OINTMENT | RECTAL | Status: DC | PRN

## 2016-10-16 MED ORDER — ACETAMINOPHEN 325 MG PO TABS
650.0000 mg | ORAL_TABLET | ORAL | Status: DC | PRN
  Administered 2016-10-18: 650 mg via ORAL
  Filled 2016-10-16: qty 2

## 2016-10-16 MED ORDER — ONDANSETRON HCL 4 MG/2ML IJ SOLN: 4.0000 mg | Freq: Four times a day (QID) | INTRAMUSCULAR | Status: DC | PRN

## 2016-10-16 MED ORDER — OXYCODONE HCL 5 MG PO TABS
5.0000 mg | ORAL_TABLET | ORAL | Status: DC | PRN
  Administered 2016-10-17 – 2016-10-23 (×11): 5 mg via ORAL
  Filled 2016-10-16 (×12): qty 1

## 2016-10-16 MED ORDER — DIPHENHYDRAMINE HCL 50 MG/ML IJ SOLN: 12.5000 mg | INTRAMUSCULAR | Status: DC | PRN

## 2016-10-16 MED ORDER — FENTANYL 2.5 MCG/ML BUPIVACAINE 1/10 % EPIDURAL INFUSION (WH - ANES)
14.0000 mL/h | INTRAMUSCULAR | Status: DC | PRN
  Administered 2016-10-16: 14 mL/h via EPIDURAL
  Filled 2016-10-16: qty 125

## 2016-10-16 MED ORDER — MEPERIDINE HCL 25 MG/ML IJ SOLN
INTRAMUSCULAR | Status: DC | PRN
  Administered 2016-10-16 (×2): 12.5 mg via INTRAVENOUS

## 2016-10-16 MED ORDER — FENTANYL CITRATE (PF) 100 MCG/2ML IJ SOLN
INTRAMUSCULAR | Status: AC
  Filled 2016-10-16: qty 2

## 2016-10-16 MED ORDER — OXYTOCIN 10 UNIT/ML IJ SOLN
INTRAMUSCULAR | Status: DC | PRN
  Administered 2016-10-16: 40 [IU] via INTRAVENOUS

## 2016-10-16 MED ORDER — BUPIVACAINE HCL (PF) 0.25 % IJ SOLN
INTRAMUSCULAR | Status: AC
  Filled 2016-10-16: qty 30

## 2016-10-16 MED ORDER — TETANUS-DIPHTH-ACELL PERTUSSIS 5-2.5-18.5 LF-MCG/0.5 IM SUSP: 0.5000 mL | Freq: Once | INTRAMUSCULAR | Status: DC

## 2016-10-16 MED ORDER — WITCH HAZEL-GLYCERIN EX PADS: 1.0000 "application " | MEDICATED_PAD | CUTANEOUS | Status: DC | PRN

## 2016-10-16 MED ORDER — LACTATED RINGERS IV SOLN: 500.0000 mL | INTRAVENOUS | Status: DC | PRN

## 2016-10-16 MED ORDER — LACTATED RINGERS IV SOLN
INTRAVENOUS | Status: DC | PRN
  Administered 2016-10-16: 11:00:00 via INTRAVENOUS

## 2016-10-16 MED ORDER — SIMETHICONE 80 MG PO CHEW
80.0000 mg | CHEWABLE_TABLET | ORAL | Status: DC | PRN
  Administered 2016-10-22: 80 mg via ORAL
  Filled 2016-10-16: qty 1

## 2016-10-16 MED ORDER — METHYLERGONOVINE MALEATE 0.2 MG/ML IJ SOLN: 0.2000 mg | INTRAMUSCULAR | Status: DC | PRN

## 2016-10-16 MED ORDER — SENNOSIDES-DOCUSATE SODIUM 8.6-50 MG PO TABS
2.0000 | ORAL_TABLET | ORAL | Status: DC
Start: 2016-10-17 — End: 2016-10-23
  Administered 2016-10-17 – 2016-10-22 (×7): 2 via ORAL
  Filled 2016-10-16 (×6): qty 2

## 2016-10-16 MED ORDER — SODIUM BICARBONATE 8.4 % IV SOLN
INTRAVENOUS | Status: DC | PRN
  Administered 2016-10-16 (×3): 5 mL via EPIDURAL

## 2016-10-16 MED ORDER — LACTATED RINGERS IV SOLN: INTRAVENOUS | Status: DC

## 2016-10-16 MED ORDER — PRENATAL MULTIVITAMIN CH
1.0000 | ORAL_TABLET | Freq: Every day | ORAL | Status: DC
  Administered 2016-10-17 – 2016-10-23 (×7): 1 via ORAL
  Filled 2016-10-16 (×7): qty 1

## 2016-10-16 MED ORDER — DIPHENHYDRAMINE HCL 25 MG PO CAPS: 25.0000 mg | ORAL_CAPSULE | Freq: Four times a day (QID) | ORAL | Status: DC | PRN

## 2016-10-16 MED ORDER — METOCLOPRAMIDE HCL 5 MG/ML IJ SOLN: 10.0000 mg | Freq: Once | INTRAMUSCULAR | Status: DC | PRN

## 2016-10-16 MED ORDER — MAGNESIUM SULFATE BOLUS VIA INFUSION
4.0000 g | Freq: Once | INTRAVENOUS | Status: AC
  Administered 2016-10-16: 4 g via INTRAVENOUS
  Filled 2016-10-16: qty 500

## 2016-10-16 MED ORDER — LIDOCAINE-EPINEPHRINE (PF) 2 %-1:200000 IJ SOLN
INTRAMUSCULAR | Status: AC
  Filled 2016-10-16: qty 20

## 2016-10-16 MED ORDER — NALBUPHINE HCL 10 MG/ML IJ SOLN: 5.0000 mg | INTRAMUSCULAR | Status: DC | PRN

## 2016-10-16 MED ORDER — SODIUM CHLORIDE 0.9 % IV SOLN
Freq: Once | INTRAVENOUS | Status: AC
  Administered 2016-10-16: 21:00:00 via INTRAVENOUS

## 2016-10-16 MED ORDER — LACTATED RINGERS IV SOLN
INTRAVENOUS | Status: DC | PRN
  Administered 2016-10-16 (×4): via INTRAVENOUS

## 2016-10-16 MED ORDER — ONDANSETRON HCL 4 MG/2ML IJ SOLN
INTRAMUSCULAR | Status: DC | PRN
  Administered 2016-10-16: 4 mg via INTRAVENOUS

## 2016-10-16 MED ORDER — KETOROLAC TROMETHAMINE 30 MG/ML IJ SOLN: 30.0000 mg | Freq: Four times a day (QID) | INTRAMUSCULAR | Status: AC | PRN

## 2016-10-16 MED ORDER — DEXAMETHASONE SODIUM PHOSPHATE 4 MG/ML IJ SOLN
INTRAMUSCULAR | Status: AC
  Filled 2016-10-16: qty 1

## 2016-10-16 MED ORDER — BUPIVACAINE HCL (PF) 0.25 % IJ SOLN
INTRAMUSCULAR | Status: DC | PRN
  Administered 2016-10-16: 30 mL

## 2016-10-16 MED ORDER — SOD CITRATE-CITRIC ACID 500-334 MG/5ML PO SOLN
ORAL | Status: AC
  Administered 2016-10-16: 30 mL
  Filled 2016-10-16: qty 15

## 2016-10-16 MED ORDER — DEXAMETHASONE SODIUM PHOSPHATE 4 MG/ML IJ SOLN
INTRAMUSCULAR | Status: DC | PRN
  Administered 2016-10-16: 4 mg via INTRAVENOUS

## 2016-10-16 MED ORDER — CEFAZOLIN SODIUM-DEXTROSE 2-4 GM/100ML-% IV SOLN
INTRAVENOUS | Status: AC
  Filled 2016-10-16: qty 100

## 2016-10-16 MED ORDER — ACETAMINOPHEN 325 MG PO TABS
650.0000 mg | ORAL_TABLET | Freq: Once | ORAL | Status: AC
  Administered 2016-10-16: 650 mg via ORAL
  Filled 2016-10-16: qty 2

## 2016-10-16 MED ORDER — ONDANSETRON HCL 4 MG/2ML IJ SOLN
INTRAMUSCULAR | Status: AC
  Filled 2016-10-16: qty 2

## 2016-10-16 MED ORDER — PHENYLEPHRINE 40 MCG/ML (10ML) SYRINGE FOR IV PUSH (FOR BLOOD PRESSURE SUPPORT)
PREFILLED_SYRINGE | INTRAVENOUS | Status: AC
  Filled 2016-10-16: qty 10

## 2016-10-16 MED ORDER — IBUPROFEN 600 MG PO TABS
600.0000 mg | ORAL_TABLET | Freq: Four times a day (QID) | ORAL | Status: DC
  Administered 2016-10-17 – 2016-10-23 (×20): 600 mg via ORAL
  Filled 2016-10-16 (×20): qty 1

## 2016-10-16 MED ORDER — SODIUM CHLORIDE 0.9% FLUSH: 3.0000 mL | INTRAVENOUS | Status: DC | PRN

## 2016-10-16 MED ORDER — MEPERIDINE HCL 25 MG/ML IJ SOLN
INTRAMUSCULAR | Status: AC
  Filled 2016-10-16: qty 1

## 2016-10-16 MED ORDER — ONDANSETRON HCL 4 MG/2ML IJ SOLN: 4.0000 mg | Freq: Three times a day (TID) | INTRAMUSCULAR | Status: DC | PRN

## 2016-10-16 MED ORDER — ZOLPIDEM TARTRATE 5 MG PO TABS: 5.0000 mg | ORAL_TABLET | Freq: Every evening | ORAL | Status: DC | PRN

## 2016-10-16 MED ORDER — OXYCODONE-ACETAMINOPHEN 5-325 MG PO TABS: 2.0000 | ORAL_TABLET | ORAL | Status: DC | PRN

## 2016-10-16 MED ORDER — MORPHINE SULFATE (PF) 0.5 MG/ML IJ SOLN
INTRAMUSCULAR | Status: AC
  Filled 2016-10-16: qty 10

## 2016-10-16 MED ORDER — DIPHENHYDRAMINE HCL 50 MG/ML IJ SOLN
25.0000 mg | Freq: Once | INTRAMUSCULAR | Status: AC
  Administered 2016-10-16: 25 mg via INTRAVENOUS
  Filled 2016-10-16: qty 1

## 2016-10-16 MED ORDER — LIDOCAINE HCL (PF) 1 % IJ SOLN
INTRAMUSCULAR | Status: DC | PRN
  Administered 2016-10-16 (×2): 5 mL

## 2016-10-16 MED ORDER — EPHEDRINE 5 MG/ML INJ: 10.0000 mg | INTRAVENOUS | Status: DC | PRN

## 2016-10-16 MED ORDER — PHENYLEPHRINE 40 MCG/ML (10ML) SYRINGE FOR IV PUSH (FOR BLOOD PRESSURE SUPPORT)
80.0000 ug | PREFILLED_SYRINGE | INTRAVENOUS | Status: DC | PRN
  Filled 2016-10-16: qty 10

## 2016-10-16 MED ORDER — FENTANYL CITRATE (PF) 100 MCG/2ML IJ SOLN
25.0000 ug | INTRAMUSCULAR | Status: DC | PRN
  Administered 2016-10-16: 50 ug via INTRAVENOUS

## 2016-10-16 MED ORDER — LACTATED RINGERS IV SOLN: 500.0000 mL | Freq: Once | INTRAVENOUS | Status: DC

## 2016-10-16 MED ORDER — LACTATED RINGERS IV BOLUS (SEPSIS)
500.0000 mL | Freq: Once | INTRAVENOUS | Status: AC
  Administered 2016-10-16: 500 mL via INTRAVENOUS

## 2016-10-16 MED ORDER — METHYLERGONOVINE MALEATE 0.2 MG PO TABS: 0.2000 mg | ORAL_TABLET | ORAL | Status: DC | PRN

## 2016-10-16 MED ORDER — DIPHENHYDRAMINE HCL 25 MG PO CAPS
25.0000 mg | ORAL_CAPSULE | ORAL | Status: DC | PRN
  Filled 2016-10-16: qty 1

## 2016-10-16 MED ORDER — MAGNESIUM SULFATE 50 % IJ SOLN
2.0000 g/h | INTRAVENOUS | Status: DC
  Administered 2016-10-17: 2 g/h via INTRAVENOUS
  Filled 2016-10-16 (×2): qty 80

## 2016-10-16 MED ORDER — OXYCODONE-ACETAMINOPHEN 5-325 MG PO TABS
1.0000 | ORAL_TABLET | ORAL | Status: DC | PRN
  Administered 2016-10-16: 2 via ORAL
  Filled 2016-10-16: qty 2

## 2016-10-16 MED ORDER — OXYCODONE-ACETAMINOPHEN 5-325 MG PO TABS: 1.0000 | ORAL_TABLET | ORAL | Status: DC | PRN

## 2016-10-16 MED ORDER — OXYTOCIN 10 UNIT/ML IJ SOLN
INTRAMUSCULAR | Status: AC
  Filled 2016-10-16: qty 4

## 2016-10-16 MED ORDER — SCOPOLAMINE 1 MG/3DAYS TD PT72
MEDICATED_PATCH | TRANSDERMAL | Status: DC | PRN
  Administered 2016-10-16: 1 via TRANSDERMAL

## 2016-10-16 MED ORDER — SIMETHICONE 80 MG PO CHEW
80.0000 mg | CHEWABLE_TABLET | ORAL | Status: DC
Start: 2016-10-17 — End: 2016-10-23
  Administered 2016-10-17 – 2016-10-22 (×7): 80 mg via ORAL
  Filled 2016-10-16 (×6): qty 1

## 2016-10-16 MED ORDER — FLEET ENEMA 7-19 GM/118ML RE ENEM: 1.0000 | ENEMA | RECTAL | Status: DC | PRN

## 2016-10-16 MED ORDER — COCONUT OIL OIL
1.0000 | TOPICAL_OIL | Status: DC | PRN
Start: 2016-10-16 — End: 2016-10-23
  Administered 2016-10-23: 1 via TOPICAL
  Filled 2016-10-16: qty 120

## 2016-10-16 MED ORDER — CEFAZOLIN SODIUM-DEXTROSE 2-4 GM/100ML-% IV SOLN
2.0000 g | Freq: Once | INTRAVENOUS | Status: AC
  Administered 2016-10-16 (×2): 2 g via INTRAVENOUS
  Filled 2016-10-16: qty 100

## 2016-10-16 MED ORDER — MEPERIDINE HCL 25 MG/ML IJ SOLN
6.2500 mg | INTRAMUSCULAR | Status: DC | PRN
  Administered 2016-10-16: 12.5 mg via INTRAVENOUS

## 2016-10-16 MED ORDER — OXYTOCIN BOLUS FROM INFUSION: 500.0000 mL | Freq: Once | INTRAVENOUS | Status: DC

## 2016-10-16 MED ORDER — OXYTOCIN 40 UNITS IN LACTATED RINGERS INFUSION - SIMPLE MED: 2.5000 [IU]/h | INTRAVENOUS | Status: DC

## 2016-10-16 MED ORDER — OXYTOCIN 40 UNITS IN LACTATED RINGERS INFUSION - SIMPLE MED: 2.5000 [IU]/h | INTRAVENOUS | Status: AC

## 2016-10-16 MED ORDER — ACETAMINOPHEN 325 MG PO TABS: 650.0000 mg | ORAL_TABLET | ORAL | Status: DC | PRN

## 2016-10-16 MED ORDER — SIMETHICONE 80 MG PO CHEW
80.0000 mg | CHEWABLE_TABLET | Freq: Three times a day (TID) | ORAL | Status: DC
  Administered 2016-10-17 – 2016-10-23 (×17): 80 mg via ORAL
  Filled 2016-10-16 (×17): qty 1

## 2016-10-16 MED ORDER — PHENYLEPHRINE 40 MCG/ML (10ML) SYRINGE FOR IV PUSH (FOR BLOOD PRESSURE SUPPORT): 80.0000 ug | PREFILLED_SYRINGE | INTRAVENOUS | Status: DC | PRN

## 2016-10-16 MED ORDER — LACTATED RINGERS IV SOLN
INTRAVENOUS | Status: DC
  Administered 2016-10-17 (×3): via INTRAVENOUS

## 2016-10-16 MED ORDER — SCOPOLAMINE 1 MG/3DAYS TD PT72: 1.0000 | MEDICATED_PATCH | Freq: Once | TRANSDERMAL | Status: DC

## 2016-10-16 MED ORDER — MEPERIDINE HCL 25 MG/ML IJ SOLN: 6.2500 mg | INTRAMUSCULAR | Status: DC | PRN

## 2016-10-16 MED ORDER — SODIUM CHLORIDE 0.9 % IJ SOLN
INTRAMUSCULAR | Status: AC
  Filled 2016-10-16: qty 20

## 2016-10-16 MED ORDER — NALOXONE HCL 2 MG/2ML IJ SOSY
1.0000 ug/kg/h | PREFILLED_SYRINGE | INTRAVENOUS | Status: DC | PRN
  Filled 2016-10-16: qty 2

## 2016-10-16 MED ORDER — LIDOCAINE HCL (PF) 1 % IJ SOLN: 30.0000 mL | INTRAMUSCULAR | Status: DC | PRN

## 2016-10-16 MED ORDER — SCOPOLAMINE 1 MG/3DAYS TD PT72
MEDICATED_PATCH | TRANSDERMAL | Status: AC
  Filled 2016-10-16: qty 1

## 2016-10-16 MED ORDER — NALOXONE HCL 0.4 MG/ML IJ SOLN: 0.4000 mg | INTRAMUSCULAR | Status: DC | PRN

## 2016-10-16 SURGICAL SUPPLY — 40 items
APL SKNCLS STERI-STRIP NONHPOA (GAUZE/BANDAGES/DRESSINGS) ×1
BENZOIN TINCTURE PRP APPL 2/3 (GAUZE/BANDAGES/DRESSINGS) ×1 IMPLANT
CHLORAPREP W/TINT 26ML (MISCELLANEOUS) ×2 IMPLANT
CLAMP CORD UMBIL (MISCELLANEOUS) IMPLANT
CLOTH BEACON ORANGE TIMEOUT ST (SAFETY) ×2 IMPLANT
CONTAINER PREFILL 10% NBF 15ML (MISCELLANEOUS) IMPLANT
DRSG OPSITE POSTOP 4X10 (GAUZE/BANDAGES/DRESSINGS) ×2 IMPLANT
DRSG PAD ABDOMINAL 8X10 ST (GAUZE/BANDAGES/DRESSINGS) ×1 IMPLANT
ELECT REM PT RETURN 9FT ADLT (ELECTROSURGICAL) ×2
ELECTRODE REM PT RTRN 9FT ADLT (ELECTROSURGICAL) ×1 IMPLANT
EXTRACTOR VACUUM M CUP 4 TUBE (SUCTIONS) IMPLANT
GLOVE BIO SURGEON STRL SZ7.5 (GLOVE) ×2 IMPLANT
GLOVE BIOGEL PI IND STRL 7.0 (GLOVE) ×1 IMPLANT
GLOVE BIOGEL PI INDICATOR 7.0 (GLOVE) ×1
GOWN STRL REUS W/TWL LRG LVL3 (GOWN DISPOSABLE) ×4 IMPLANT
KIT ABG SYR 3ML LUER SLIP (SYRINGE) IMPLANT
NDL HYPO 25X5/8 SAFETYGLIDE (NEEDLE) IMPLANT
NDL SPNL 20GX3.5 QUINCKE YW (NEEDLE) IMPLANT
NEEDLE HYPO 22GX1.5 SAFETY (NEEDLE) ×2 IMPLANT
NEEDLE HYPO 25X5/8 SAFETYGLIDE (NEEDLE) IMPLANT
NEEDLE SPNL 20GX3.5 QUINCKE YW (NEEDLE) IMPLANT
NS IRRIG 1000ML POUR BTL (IV SOLUTION) ×2 IMPLANT
PACK C SECTION WH (CUSTOM PROCEDURE TRAY) ×2 IMPLANT
PENCIL SMOKE EVAC W/HOLSTER (ELECTROSURGICAL) ×2 IMPLANT
SPONGE GAUZE 4X4 12PLY (GAUZE/BANDAGES/DRESSINGS) ×1 IMPLANT
STRIP CLOSURE SKIN 1/2X4 (GAUZE/BANDAGES/DRESSINGS) ×1 IMPLANT
SUT MNCRL 0 VIOLET CTX 36 (SUTURE) ×2 IMPLANT
SUT MNCRL AB 3-0 PS2 27 (SUTURE) IMPLANT
SUT MON AB 2-0 CT1 27 (SUTURE) ×2 IMPLANT
SUT MON AB-0 CT1 36 (SUTURE) ×4 IMPLANT
SUT MONOCRYL 0 CTX 36 (SUTURE) ×2
SUT PLAIN 0 NONE (SUTURE) IMPLANT
SUT PLAIN 2 0 (SUTURE)
SUT PLAIN 2 0 XLH (SUTURE) ×1 IMPLANT
SUT PLAIN ABS 2-0 CT1 27XMFL (SUTURE) IMPLANT
SYR 20CC LL (SYRINGE) IMPLANT
SYR CONTROL 10ML LL (SYRINGE) ×2 IMPLANT
TAPE HYPAFIX 6X30 (GAUZE/BANDAGES/DRESSINGS) ×1 IMPLANT
TOWEL OR 17X24 6PK STRL BLUE (TOWEL DISPOSABLE) ×2 IMPLANT
TRAY FOLEY CATH SILVER 14FR (SET/KITS/TRAYS/PACK) ×2 IMPLANT

## 2016-10-16 NOTE — MAU Note (Signed)
Pt presents stating her water broke at 0600 with copious amounts of bleeding. Reports contractions every 3 minutes. Denies bleeding.

## 2016-10-16 NOTE — H&P (Addendum)
Maria Prince is a 30 y.o. female presenting for SROM at 0600. No headaches or visual changes. Reports all BS wnl.   OB History    Gravida Para Term Preterm AB Living   1             SAB TAB Ectopic Multiple Live Births                 Past Medical History:  Diagnosis Date  . Diabetes mellitus without complication (HCC)    gestational  . 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 No pertinent surgical history. Family History: family history includes Hyperlipidemia in her other. Social History:  reports that she has never smoked. She has never used smokeless tobacco. She reports that she drinks alcohol. She reports that she does not use drugs.     Maternal Diabetes: Yes:  Diabetes Type:  Diet controlled Genetic Screening: Normal Maternal Ultrasounds/Referrals: Normal Fetal Ultrasounds or other Referrals:  None Maternal Substance Abuse:  No Significant Maternal Medications:  None Significant Maternal Lab Results:  None Other Comments:  None  Review of Systems  Constitutional: Negative.   HENT: Negative.   Eyes: Negative.   All other systems reviewed and are negative.  Maternal Medical History:  Reason for admission: Rupture of membranes.   Contractions: Onset was less than 1 hour ago.   Frequency: irregular.   Perceived severity is moderate.    Fetal activity: Perceived fetal activity is normal.   Last perceived fetal movement was within the past hour.    Prenatal complications: no prenatal complications Prenatal Complications - Diabetes: gestational.    Dilation: 2 Effacement (%): 90 Station: -1 Exam by:: Ginger Morris rn Blood pressure (!) 144/96, pulse 76, temperature 97.9 F (36.6 C), temperature source Oral, resp. rate 16, SpO2 95 %. Maternal Exam:  Uterine Assessment: Contraction strength is moderate.  Contraction frequency is irregular.   Abdomen: Patient reports no abdominal tenderness. Fetal presentation: vertex  Introitus: Normal  vulva. Normal vagina.  Ferning test: positive.  Nitrazine test: positive. Amniotic fluid character: clear.     Physical Exam  Nursing note and vitals reviewed. Constitutional: She is oriented to person, place, and time. She appears well-developed and well-nourished.  HENT:  Head: Normocephalic and atraumatic.  Neck: Normal range of motion. Neck supple.  Cardiovascular: Normal rate and regular rhythm.   Respiratory: Effort normal and breath sounds normal.  GI: Soft. Bowel sounds are normal.  Genitourinary: Vagina normal and uterus normal.  Musculoskeletal: Normal range of motion.  Neurological: She is alert and oriented to person, place, and time. She has normal reflexes.  Skin: Skin is warm and dry.  Psychiatric: She has a normal mood and affect.    Prenatal labs: ABO, Rh:   Antibody:   Rubella:   RPR:    HBsAg:    HIV:    GBS:     Assessment/Plan: 36 week DIDI twin gestation SROM twin A GDM- well controlled by report GBS unknown- rapid strep pending. Will give Ampicillin now. Gestational HTN vs PEC- Urine PC ratio pending. Labs noted. LFTs nl. Plts 107K. VTX/Transverse presentation- options discussed. Will not offer breech extraction of twin B. Will proceed with epidural now. Pt still considering delivery options.    Maria Prince 10/16/2016, 8:46 AM

## 2016-10-16 NOTE — Anesthesia Procedure Notes (Signed)
Epidural Patient location during procedure: OB  Staffing Anesthesiologist: Bettyjean Stefanski Performed: anesthesiologist   Preanesthetic Checklist Completed: patient identified, site marked, surgical consent, pre-op evaluation, timeout performed, IV checked, risks and benefits discussed and monitors and equipment checked  Epidural Patient position: sitting Prep: DuraPrep Patient monitoring: heart rate, continuous pulse ox and blood pressure Approach: right paramedian Location: L3-L4 Injection technique: LOR saline  Needle:  Needle type: Tuohy  Needle gauge: 17 G Needle length: 9 cm and 9 Needle insertion depth: 6 cm Catheter type: closed end flexible Catheter size: 20 Guage Catheter at skin depth: 10 cm Test dose: negative  Assessment Events: blood not aspirated, injection not painful, no injection resistance, negative IV test and no paresthesia  Additional Notes Patient identified. Risks/Benefits/Options discussed with patient including but not limited to bleeding, infection, nerve damage, paralysis, failed block, incomplete pain control, headache, blood pressure changes, nausea, vomiting, reactions to medication both or allergic, itching and postpartum back pain. Confirmed with bedside nurse the patient's most recent platelet count. Confirmed with patient that they are not currently taking any anticoagulation, have any bleeding history or any family history of bleeding disorders. Patient expressed understanding and wished to proceed. All questions were answered. Sterile technique was used throughout the entire procedure. Please see nursing notes for vital signs. Test dose was given through epidural needle and negative prior to continuing to dose epidural or start infusion. Warning signs of high block given to the patient including shortness of breath, tingling/numbness in hands, complete motor block, or any concerning symptoms with instructions to call for help. Patient was given  instructions on fall risk and not to get out of bed. All questions and concerns addressed with instructions to call with any issues.     

## 2016-10-16 NOTE — Lactation Note (Signed)
This note was copied from a baby's chart. Lactation Consultation Note  Patient Name: Merideth AbbeyGirlA Betania Chrostowski Today's Date: 10/16/2016 Reason for consult: Initial assessment;NICU baby;Multiple gestation  NICU twins, 7 hours old. Mom has already pumped once with no colostrum present. Discussed progression of milk coming to volume. Enc mom to pump for 15 minutes every 2-3 hours for a total of 8 times/24 hours. Assisted mom to use DEBP again, and refitted mom with #21 flanges. Mom has labels, discussed how to apply to colostrum containers for twins. Mom given NICU booklet and LC brochure with review. Enc offering STS and nuzzling/latching at the breast as she and babies are able.  Maternal Data Has patient been taught Hand Expression?: Yes Does the patient have breastfeeding experience prior to this delivery?: No  Feeding    LATCH Score/Interventions                      Lactation Tools Discussed/Used WIC Program: No Pump Review: Setup, frequency, and cleaning;Milk Storage Initiated by:: Bedside RN Date initiated:: 10/16/16   Consult Status Consult Status: Follow-up Date: 10/17/16 Follow-up type: In-patient    Sherlyn HayJennifer D Joycelin Radloff 10/16/2016, 6:49 PM

## 2016-10-16 NOTE — Anesthesia Preprocedure Evaluation (Signed)
Anesthesia Evaluation  Patient identified by MRN, date of birth, ID band Patient awake    Reviewed: Allergy & Precautions, H&P , NPO status , Patient's Chart, lab work & pertinent test results  History of Anesthesia Complications Negative for: history of anesthetic complications  Airway Mallampati: II  TM Distance: >3 FB Neck ROM: full    Dental no notable dental hx. (+) Teeth Intact   Pulmonary neg pulmonary ROS,    Pulmonary exam normal breath sounds clear to auscultation       Cardiovascular hypertension, negative cardio ROS Normal cardiovascular exam Rhythm:regular Rate:Normal     Neuro/Psych negative neurological ROS  negative psych ROS   GI/Hepatic negative GI ROS, Neg liver ROS,   Endo/Other  negative endocrine ROSdiabetes  Renal/GU negative Renal ROS  negative genitourinary   Musculoskeletal   Abdominal   Peds  Hematology negative hematology ROS (+)   Anesthesia Other Findings   Reproductive/Obstetrics (+) Pregnancy                             Anesthesia Physical Anesthesia Plan  ASA: II  Anesthesia Plan: Epidural   Post-op Pain Management:    Induction:   Airway Management Planned:   Additional Equipment:   Intra-op Plan:   Post-operative Plan:   Informed Consent: I have reviewed the patients History and Physical, chart, labs and discussed the procedure including the risks, benefits and alternatives for the proposed anesthesia with the patient or authorized representative who has indicated his/her understanding and acceptance.     Plan Discussed with:   Anesthesia Plan Comments:         Anesthesia Quick Evaluation

## 2016-10-16 NOTE — Progress Notes (Signed)
Patient ID: Maria Prince, female   DOB: Feb 13, 1986, 30 y.o.   MRN: [id number] PEC w/o severe features by urine and BP criteria. No headache or epigastric pain BP 130/81   Pulse 93   Temp 98.2 F (36.8 C) (Oral)   Resp 18   SpO2 98%   Pt and husband desire csection after consideration of options.  Consent done.

## 2016-10-16 NOTE — MAU Provider Note (Signed)
Bedside US: baby A vertex by SVE. Baby B transverse head to the left, back up.   Tawnya CrookHogan, Heather Donovan  7:23 AM 10/16/16

## 2016-10-16 NOTE — Transfer of Care (Signed)
Immediate Anesthesia Transfer of Care Note  Patient: Maria Prince  Procedure(s) Performed: Procedure(s): CESAREAN SECTION (N/A)  Patient Location: PACU  Anesthesia Type:Epidural  Level of Consciousness: awake, alert  and oriented  Airway & Oxygen Therapy: Patient Spontanous Breathing  Post-op Assessment: Report given to RN and Post -op Vital signs reviewed and stable  Post vital signs: Reviewed and stable  Last Vitals:  Vitals:   10/16/16 1028 10/16/16 1056  BP: 110/67 130/81  Pulse: 90 93  Resp:    Temp:      Last Pain:  Vitals:   10/16/16 0926  TempSrc: Oral  PainSc:       Patients Stated Pain Goal: 4 (10/16/16 0916)  Complications: No apparent anesthesia complications

## 2016-10-16 NOTE — Op Note (Signed)
Cesarean Section Procedure Note  Indications: malpresentation: twin B and PEC w/o severe features  Pre-operative Diagnosis: 36 week 1 day pregnancy.  Post-operative Diagnosis: same  Surgeon: Lenoard AdenAAVON,Dominic Rhome J   Assistants: Arita Missawson, CNM  Anesthesia: Epidural anesthesia and Local anesthesia 0.25.% bupivacaine  ASA Class: 2  Procedure Details  The patient was seen in the Holding Room. The risks, benefits, complications, treatment options, and expected outcomes were discussed with the patient.  The patient concurred with the proposed plan, giving informed consent. The risks of anesthesia, infection, bleeding and possible injury to other organs discussed. Injury to bowel, bladder, or ureter with possible need for repair discussed. Possible need for transfusion with secondary risks of hepatitis or HIV acquisition discussed. Post operative complications to include but not limited to DVT, PE and Pneumonia noted. The site of surgery properly noted/marked. The patient was taken to Operating Room # 9, identified as Maria Prince and the procedure verified as C-Section Delivery. A Time Out was held and the above information confirmed.  After induction of anesthesia, the patient was draped and prepped in the usual sterile manner. A Pfannenstiel incision was made and carried down through the subcutaneous tissue to the fascia. Fascial incision was made and extended transversely using Mayo scissors. The fascia was separated from the underlying rectus tissue superiorly and inferiorly. The peritoneum was identified and entered. Peritoneal incision was extended longitudinally. The utero-vesical peritoneal reflection was incised transversely and the bladder flap was bluntly freed from the lower uterine segment. A low transverse uterine incision(Kerr hysterotomy) was made. Delivered from OA presentation was a  Female twin A with Apgar scores of 8 at one minute and 8 at five minutes. Bulb suctioning gently performed.  Neonatal team in attendance.After the umbilical cord was clamped and cut cord blood was obtained for evaluation.Delivered from footling breech  presentation with triple nuchal cord reduced was a  Female twin B with Apgar scores of 8 at one minute and 8 at five minutes. Bulb suctioning gently performed. Neonatal team in attendance.After the umbilical cord was clamped and cut cord blood was obtained for evaluation. The placenta was removed intact and appeared normal. The uterus was curetted with a dry lap pack. Good hemostasis was noted.The uterine outline, tubes and ovaries appeared normal. The uterine incision was closed with running locked sutures of 0 Monocryl x 2 layers. Hemostasis was observed. Lavage was carried out until clear.The parietal peritoneum was closed with a running 2-0 Monocryl suture. The fascia was then reapproximated with running sutures of 0 Monocryl. The skin was reapproximated with 3-0 monocryl after [place] closure with 2-0 plain.  Instrument, sponge, and needle counts were correct prior the abdominal closure and at the conclusion of the case.   Findings: As noted  Estimated Blood Loss:  1500         Drains: foley                 Specimens: placenta x 2                 Complications:  None; patient tolerated the procedure well.         Disposition: PACU - hemodynamically stable.         Condition: stable  Attending Attestation: I performed the procedure.

## 2016-10-16 NOTE — Progress Notes (Signed)
Called for hypotension in pt who  Had primary C/S today for malpresentation 2nd twin with severe preeclampsia complicated by PPH On arrival rapid response had been called and Dr Lynetta MareAnyawu was present at pt's bedside Per RN, Magnesium had been stopped due to pt's blood pressure.  CBC had been ordered.  Pt was alert pale but oriented. She was normotensive Exam showed firm uterus @ 2 fb below umbilicus with surgical tenderness and intact dressing. Mod lochia Orders given for DIC panel, magnesium level, cbc. Resume magnesium. Ok for 500 cc bolus that was already in progress. Pt's husband and mother informed of assessment and plan of care  4units PRBC ordered. Await remaining labs. Risk of transfusion reviewed with pt:  HIV transmission, acute rxn, hepatitis. Consent signed

## 2016-10-16 NOTE — Anesthesia Rounding Note (Signed)
  [facility] Epidural Rounding Note  Patient: Maria Prince, 30 y.o., female  Patient's current pain level: Pain Score: 3  (10/16/16 0916)  Agreed upon pain management level: 4  Epidural intervention: No   Comments: Epidural placed at 0900 per Dr Willa Fraterarrigan. Patient comfortable  [facility] Eye Associates IncEIGHT,Maria Prince 10/16/2016

## 2016-10-17 LAB — CBC
HEMATOCRIT: 30.2 % — AB (ref 36.0–46.0)
HEMATOCRIT: 24.2 % — AB (ref 36.0–46.0)
HEMOGLOBIN: 10.6 g/dL — AB (ref 12.0–15.0)
Hemoglobin: 8.3 g/dL — ABNORMAL LOW (ref 12.0–15.0)
MCH: 29.6 pg (ref 26.0–34.0)
MCH: 29.5 pg (ref 26.0–34.0)
MCHC: 35.1 g/dL (ref 30.0–36.0)
MCHC: 34.3 g/dL (ref 30.0–36.0)
MCV: 84.4 fL (ref 78.0–100.0)
MCV: 86.1 fL (ref 78.0–100.0)
PLATELETS: 92 10*3/uL — AB (ref 150–400)
Platelets: 87 10*3/uL — ABNORMAL LOW (ref 150–400)
RBC: 3.58 MIL/uL — AB (ref 3.87–5.11)
RBC: 2.81 MIL/uL — AB (ref 3.87–5.11)
RDW: 14.3 % (ref 11.5–15.5)
RDW: 14.4 % (ref 11.5–15.5)
WBC: 13.2 10*3/uL — ABNORMAL HIGH (ref 4.0–10.5)
WBC: 13.5 10*3/uL — ABNORMAL HIGH (ref 4.0–10.5)

## 2016-10-17 LAB — MAGNESIUM
MAGNESIUM: 6.2 mg/dL — AB (ref 1.7–2.4)
Magnesium: 5.6 mg/dL — ABNORMAL HIGH (ref 1.7–2.4)

## 2016-10-17 LAB — COMPREHENSIVE METABOLIC PANEL
ALBUMIN: 1.6 g/dL — AB (ref 3.5–5.0)
ALT: 12 U/L — ABNORMAL LOW (ref 14–54)
AST: 27 U/L (ref 15–41)
Alkaline Phosphatase: 116 U/L (ref 38–126)
Anion gap: 5 (ref 5–15)
BUN: 22 mg/dL — AB (ref 6–20)
CHLORIDE: 102 mmol/L (ref 101–111)
CO2: 23 mmol/L (ref 22–32)
Calcium: 7.5 mg/dL — ABNORMAL LOW (ref 8.9–10.3)
Creatinine, Ser: 0.99 mg/dL (ref 0.44–1.00)
GFR calc Af Amer: >60 mL/min (ref >60)
GFR calc non Af Amer: >60 mL/min (ref >60)
GLUCOSE: 96 mg/dL (ref 65–99)
POTASSIUM: 4.6 mmol/L (ref 3.5–5.1)
Sodium: 130 mmol/L — ABNORMAL LOW (ref 135–145)
Total Bilirubin: 0.8 mg/dL (ref 0.3–1.2)
Total Protein: 3.6 g/dL — ABNORMAL LOW (ref 6.5–8.1)

## 2016-10-17 MED ORDER — FUROSEMIDE 10 MG/ML IJ SOLN
20.0000 mg | Freq: Once | INTRAMUSCULAR | Status: AC
  Administered 2016-10-17: 20 mg via INTRAVENOUS
  Filled 2016-10-17: qty 2

## 2016-10-17 MED ORDER — OXYCODONE HCL 5 MG PO TABS
5.0000 mg | ORAL_TABLET | ORAL | Status: AC
  Administered 2016-10-17: 5 mg via ORAL

## 2016-10-17 MED ORDER — OXYCODONE HCL 5 MG PO TABS
10.0000 mg | ORAL_TABLET | ORAL | Status: DC | PRN
  Administered 2016-10-18 – 2016-10-22 (×5): 10 mg via ORAL
  Filled 2016-10-17 (×5): qty 2

## 2016-10-17 NOTE — Progress Notes (Signed)
CSW acknowledges NICU admission.    Patient screened out for psychosocial assessment since none of the following apply:  Psychosocial stressors documented in mother or baby's chart  Gestation less than 32 weeks  Code at delivery   Infant with anomalies  Please contact the Clinical Social Worker if specific needs arise, or by MOB's request.       

## 2016-10-17 NOTE — Progress Notes (Signed)
CRITICAL VALUE ALERT  Critical value receivedMagnesium  Date of notification: 10/17/16  Time of notification:  0615   Critical value read back:Yes.    Nurse who received alert:  Gilman SchmidtEunice Asiya Cutbirth  MD notified (1st page)Dr. Cherly Hensenousins  Time of first page: 0627  MD notified (2nd page):  Time of second page:  Responding MD: Dr. Cherly Hensenousins  Time MD responded:  [phone]

## 2016-10-17 NOTE — Progress Notes (Signed)
Pt was in good spirits and was so grateful that one of her babies is up in the room with her at this time.  She appears to be coping well with having one baby still in the NICU and has good family support.  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  Chaplain Dyanne CarrelKaty Reanna Scoggin, Bcc Pager, [phone] 5:01 PM    10/17/16 1600  Clinical Encounter Type  Visited With Patient  Visit Type Initial  Referral From Nurse  Spiritual Encounters  Spiritual Needs Emotional

## 2016-10-17 NOTE — Lactation Note (Signed)
This note was copied from a baby's chart. Lactation Consultation Note  Patient Name: Maria Prince Full [id number]'XToday's Date: 10/17/2016 Reason for consult: Follow-up assessment;Infant < 6lbs;Late preterm infant Baby B was just transferred from NICU to mother's room.  Mom is attempting to latch baby to breast.  Assisted with positioning and breast compression.  Baby latched easily and nursed actively off and on for 10 minutes.  Late Preterm handout given and reviewed.  Reviewed pumping and trying to get 8-12 pumpings/24 hours.  Mom following pumping with hand expression and obtaining a few drops.  Instructed mom to put baby to breast with feeding cues, post pump x 15 minutes and supplement with 20 mls of expressed milk/formula per bottle.  LC phone number left for mom to call for assist prn.  Maternal Data    Feeding Feeding Type: Breast Fed Length of feed: 10 min  LATCH Score/Interventions Latch: Grasps breast easily, tongue down, lips flanged, rhythmical sucking.  Audible Swallowing: A few with stimulation Intervention(s): Skin to skin;Hand expression;Alternate breast massage  Type of Nipple: Everted at rest and after stimulation  Comfort (Breast/Nipple): Soft / non-tender     Hold (Positioning): Assistance needed to correctly position infant at breast and maintain latch. Intervention(s): Breastfeeding basics reviewed;Support Pillows;Position options;Skin to skin  LATCH Score: 8  Lactation Tools Discussed/Used     Consult Status Consult Status: Follow-up Date: 10/18/16 Follow-up type: In-patient    Huston FoleyMOULDEN, [name] 10/17/2016, 11:46 AM

## 2016-10-17 NOTE — Progress Notes (Signed)
MD notified about critical lab value of magnesium of 6.2. Instructed to hold the magnesium for an hour and drop it to 1gram afterwards.

## 2016-10-17 NOTE — Anesthesia Postprocedure Evaluation (Signed)
Anesthesia Post Note  Patient: Maria Prince  Procedure(s) Performed: Procedure(s) (LRB): CESAREAN SECTION (N/A)  Patient location during evaluation: [facility] Anesthesia Type: Epidural Level of consciousness: awake and alert Pain management: pain level controlled Vital Signs Assessment: post-procedure vital signs reviewed and stable Respiratory status: spontaneous breathing and nonlabored ventilation Cardiovascular status: stable Postop Assessment: no headache, no backache, patient able to bend at knees, epidural receding, no signs of nausea or vomiting and adequate PO intake Anesthetic complications: no     Last Vitals:  Vitals:   10/17/16 0554 10/17/16 0845  BP: 111/61 120/67  Pulse: 80 75  Resp:    Temp: 36.5 C     Last Pain:  Vitals:   10/17/16 0312  TempSrc: Oral  PainSc:    Pain Goal: Patients Stated Pain Goal: 2 (10/16/16 1530)               Laban EmperorMalinova,Maria Prince

## 2016-10-17 NOTE — Progress Notes (Signed)
SVD: primary  S:  Pt reports feeling better/ Tolerating po/ Voiding without problems/ No n/v/ Bleeding is moderate/ Pain controlled withnarcotic analgesics including roxycodone    O:  A & O x 3 / VS: Blood pressure 120/67, pulse 75, temperature 97.7 F (36.5 C), resp. rate 18, weight 92.1 kg (203 lb 0.9 oz), SpO2 100 %, unknown if currently breastfeeding.  LABS:  Results for orders placed or performed during the hospital encounter of 10/16/16 (from the past 24 hour(s))  CBC     Status: Abnormal   Collection Time: 10/16/16  2:46 PM  Result Value Ref Range   WBC 16.9 (H) 4.0 - 10.5 K/uL   RBC 2.96 (L) 3.87 - 5.11 MIL/uL   Hemoglobin 8.6 (L) 12.0 - 15.0 g/dL   HCT 40.9 (L) 81.1 - 91.4 %   MCV 87.8 78.0 - 100.0 fL   MCH 29.1 26.0 - 34.0 pg   MCHC 33.1 30.0 - 36.0 g/dL   RDW 78.2 95.6 - 21.3 %   Platelets 90 (L) 150 - 400 K/uL  Comprehensive metabolic panel     Status: Abnormal   Collection Time: 10/16/16  2:46 PM  Result Value Ref Range   Sodium 135 135 - 145 mmol/L   Potassium 5.1 3.5 - 5.1 mmol/L   Chloride 107 101 - 111 mmol/L   CO2 20 (L) 22 - 32 mmol/L   Glucose, Bld 94 65 - 99 mg/dL   BUN 24 (H) 6 - 20 mg/dL   Creatinine, Ser 0.86 0.44 - 1.00 mg/dL   Calcium 8.3 (L) 8.9 - 10.3 mg/dL   Total Protein 4.2 (L) 6.5 - 8.1 g/dL   Albumin 1.9 (L) 3.5 - 5.0 g/dL   AST 30 15 - 41 U/L   ALT 13 (L) 14 - 54 U/L   Alkaline Phosphatase 150 (H) 38 - 126 U/L   Total Bilirubin 0.4 0.3 - 1.2 mg/dL   GFR calc non Af Amer >60 >60 mL/min   GFR calc Af Amer >60 >60 mL/min   Anion gap 8 5 - 15  Protime-INR     Status: None   Collection Time: 10/16/16  8:30 PM  Result Value Ref Range   Prothrombin Time 14.8 11.4 - 15.2 seconds   INR 1.16   PTT     Status: None   Collection Time: 10/16/16  8:30 PM  Result Value Ref Range   aPTT 31 24 - 36 seconds  Fibrinogen     Status: None   Collection Time: 10/16/16  8:30 PM  Result Value Ref Range   Fibrinogen 249 210 - 475 mg/dL  Magnesium      Status: Abnormal   Collection Time: 10/16/16  8:30 PM  Result Value Ref Range   Magnesium 5.1 (H) 1.7 - 2.4 mg/dL  Prepare RBC     Status: None   Collection Time: 10/16/16  9:00 PM  Result Value Ref Range   Order Confirmation ORDER PROCESSED BY BLOOD BANK   CBC     Status: Abnormal   Collection Time: 10/16/16  9:16 PM  Result Value Ref Range   WBC 14.9 (H) 4.0 - 10.5 K/uL   RBC 2.59 (L) 3.87 - 5.11 MIL/uL   Hemoglobin 7.5 (L) 12.0 - 15.0 g/dL   HCT 57.8 (L) 46.9 - 62.9 %   MCV 86.1 78.0 - 100.0 fL   MCH 29.0 26.0 - 34.0 pg   MCHC 33.6 30.0 - 36.0 g/dL   RDW 52.8 41.3 - 24.4 %  Platelets 102 (L) 150 - 400 K/uL  Comprehensive metabolic panel     Status: Abnormal   Collection Time: 10/17/16  5:15 AM  Result Value Ref Range   Sodium 130 (L) 135 - 145 mmol/L   Potassium 4.6 3.5 - 5.1 mmol/L   Chloride 102 101 - 111 mmol/L   CO2 23 22 - 32 mmol/L   Glucose, Bld 96 65 - 99 mg/dL   BUN 22 (H) 6 - 20 mg/dL   Creatinine, Ser 0.340.99 0.44 - 1.00 mg/dL   Calcium 7.5 (L) 8.9 - 10.3 mg/dL   Total Protein 3.6 (L) 6.5 - 8.1 g/dL   Albumin 1.6 (L) 3.5 - 5.0 g/dL   AST 27 15 - 41 U/L   ALT 12 (L) 14 - 54 U/L   Alkaline Phosphatase 116 38 - 126 U/L   Total Bilirubin 0.8 0.3 - 1.2 mg/dL   GFR calc non Af Amer >60 >60 mL/min   GFR calc Af Amer >60 >60 mL/min   Anion gap 5 5 - 15  Magnesium     Status: Abnormal   Collection Time: 10/17/16  5:15 AM  Result Value Ref Range   Magnesium 6.2 (HH) 1.7 - 2.4 mg/dL  CBC     Status: Abnormal   Collection Time: 10/17/16  5:15 AM  Result Value Ref Range   WBC 13.2 (H) 4.0 - 10.5 K/uL   RBC 3.58 (L) 3.87 - 5.11 MIL/uL   Hemoglobin 10.6 (L) 12.0 - 15.0 g/dL   HCT 74.230.2 (L) 59.536.0 - 63.846.0 %   MCV 84.4 78.0 - 100.0 fL   MCH 29.6 26.0 - 34.0 pg   MCHC 35.1 30.0 - 36.0 g/dL   RDW 75.614.3 43.311.5 - 29.515.5 %   Platelets 87 (L) 150 - 400 K/uL    I&O: I/O last 3 completed shifts: In: 7339.2 [P.O.:460; I.V.:5250; Blood:1629.2] Out: 3005 [Urine:905;  Blood:2100]   Total I/O In: 497.7 [I.V.:497.7] Out: 115 [Urine:115]  Lungs: chest clear, no wheezing, rales, normal symmetric air entry, Heart exam - S1, S2 normal, no murmur, no gallop, rate regular  Heart: regular rate and rhythm, S1, S2 normal, no murmur, click, rub or gallop  Abdomen: soft active BS. Uterus firm @ umb Primary d/c/i  Perineum: is normal  Lochia: mod  Extremities:edema 2++ DTR 2+    A/P: POD # 1/PPD # 1/ G1P0101 S/p primary C/S for malpresentation twin B, severe preeclampsia not diuresing as yet. (+) 4.3l ahead PPH s/p 4 units of PRBC  Continue routine post partum orders Cont magnesium until diuresing  lasix 20mg  IV now Oral gatorade to help with hyponatremia Oral iron supplement when flatus

## 2016-10-17 NOTE — Progress Notes (Signed)
Patient was up to standing this afternoon for weight and then once more to standing by choice. Did not "feel up to it," to get to sitting up in chair or walking at this time.

## 2016-10-18 LAB — CBC
HCT: 21.8 % — ABNORMAL LOW (ref 36.0–46.0)
HEMOGLOBIN: 7.8 g/dL — AB (ref 12.0–15.0)
MCH: 30.4 pg (ref 26.0–34.0)
MCHC: 35.8 g/dL (ref 30.0–36.0)
MCV: 84.8 fL (ref 78.0–100.0)
Platelets: 91 10*3/uL — ABNORMAL LOW (ref 150–400)
RBC: 2.57 MIL/uL — AB (ref 3.87–5.11)
RDW: 15 % (ref 11.5–15.5)
WBC: 12.7 10*3/uL — ABNORMAL HIGH (ref 4.0–10.5)

## 2016-10-18 LAB — TYPE AND SCREEN
ABO/RH(D): O POS
ANTIBODY SCREEN: NEGATIVE
UNIT DIVISION: 0
Unit division: 0
Unit division: 0
Unit division: 0

## 2016-10-18 LAB — COMPREHENSIVE METABOLIC PANEL
ALBUMIN: 1.7 g/dL — AB (ref 3.5–5.0)
ALK PHOS: 111 U/L (ref 38–126)
ALT: 12 U/L — AB (ref 14–54)
AST: 23 U/L (ref 15–41)
Anion gap: 3 — ABNORMAL LOW (ref 5–15)
BUN: 17 mg/dL (ref 6–20)
CALCIUM: 6.8 mg/dL — AB (ref 8.9–10.3)
CO2: 25 mmol/L (ref 22–32)
CREATININE: 0.8 mg/dL (ref 0.44–1.00)
Chloride: 103 mmol/L (ref 101–111)
GFR calc Af Amer: >60 mL/min (ref >60)
GFR calc non Af Amer: >60 mL/min (ref >60)
GLUCOSE: 87 mg/dL (ref 65–99)
Potassium: 4.6 mmol/L (ref 3.5–5.1)
SODIUM: 131 mmol/L — AB (ref 135–145)
Total Bilirubin: 0.7 mg/dL (ref 0.3–1.2)
Total Protein: 3.7 g/dL — ABNORMAL LOW (ref 6.5–8.1)

## 2016-10-18 LAB — MAGNESIUM: Magnesium: 4.4 mg/dL — ABNORMAL HIGH (ref 1.7–2.4)

## 2016-10-18 NOTE — Lactation Note (Signed)
This note was copied from a baby's chart. Lactation Consultation Note  Patient Name: Maria Prince ZOXWR'UToday's Date: 10/18/2016  Mom states baby is latching to one breast and nursing well with cues.  She is post pumping and obtaining a few mls of colostrum that she is sending for baby A in NICU.  No questions or concerns at present.   Maternal Data    Feeding Feeding Type: Breast Fed Length of feed: 15 min  LATCH Score/Interventions                      Lactation Tools Discussed/Used     Consult Status      Huston FoleyMOULDEN, Pola Furno S 10/18/2016, 2:32 PM

## 2016-10-18 NOTE — Progress Notes (Signed)
POD# 2  Pt notes feeling better, improved energy. Starting pumping with some return. Regular appetitite and no N/emesis. Still no flatus. Notes abdominal pain- gas related and R shoulder pain. No HA, no scotomata, occasional dizzziness. Foley still in and pt has not been up much. Babies well, 1 in NICU, 1 in room. Minimal lochia. Taking tylenol and oxycodone for pain, Motrin on hold.  PE:  Vitals:   10/18/16 0500 10/18/16 0600 10/18/16 0643 10/18/16 0853  BP:   125/77 (!) 124/97  Pulse:   90 83  Resp: 18 18 18 20   Temp:   99.1 F (37.3 C) 98.7 F (37.1 C)  TempSrc:   Oral Oral  SpO2:   99%   Weight:       Gen: well appearing but fatigued, no distress, good color CV: RRR Pulm: CTAB Abd: no RUQ pain, soft distension, tympany present, no rebound, no guarding. Fundus appropriately tender, at umbilicus GU: minimal lochia LE: 2+ edema, 2+ DTR, no clonus    CBC Latest Ref Rng & Units 10/18/2016 10/17/2016 10/17/2016  WBC 4.0 - 10.5 K/uL 12.7(H) 13.5(H) 13.2(H)  Hemoglobin 12.0 - 15.0 g/dL 7.8(L) 8.3(L) 10.6(L)  Hematocrit 36.0 - 46.0 % 21.8(L) 24.2(L) 30.2(L)  Platelets 150 - 400 K/uL 91(L) 92(L) 87(L)    CMP     Component Value Date/Time   NA 131 (L) 10/18/2016 0525   K 4.6 10/18/2016 0525   CL 103 10/18/2016 0525   CO2 25 10/18/2016 0525   GLUCOSE 87 10/18/2016 0525   BUN 17 10/18/2016 0525   CREATININE 0.80 10/18/2016 0525   CALCIUM 6.8 (L) 10/18/2016 0525   PROT 3.7 (L) 10/18/2016 0525   ALBUMIN 1.7 (L) 10/18/2016 0525   AST 23 10/18/2016 0525   ALT 12 (L) 10/18/2016 0525   ALKPHOS 111 10/18/2016 0525   BILITOT 0.7 10/18/2016 0525   GFRNONAA >60 10/18/2016 0525   GFRAA >60 10/18/2016 0525   I/O reviewed: still net positive but aggressive diuresis overnight  A/P: POD#2 s/p PCS for malpresentation twin B in the setting of PEC - post-op. Appropriate recovery. Large EBL. D/c foley, up and moving today. - Pain. Motrin on hold due to thrombocytopenia. Repeat CBC in am,  if plts improving will allow NSAIDs - PEC. Kidney function recovering by diuresis and improved Cr. OK to stop Mag. Bp's low, continue to watch - Anemia. Follow sx. May check orthostatics later today if pt still fatigued and struggling to ambulate after Mag stopped .  Tanav Orsak A. 10/18/2016 9:10 AM

## 2016-10-19 LAB — CBC
HCT: 21.2 % — ABNORMAL LOW (ref 36.0–46.0)
HEMOGLOBIN: 7.4 g/dL — AB (ref 12.0–15.0)
MCH: 30.7 pg (ref 26.0–34.0)
MCHC: 34.9 g/dL (ref 30.0–36.0)
MCV: 88 fL (ref 78.0–100.0)
Platelets: 129 10*3/uL — ABNORMAL LOW (ref 150–400)
RBC: 2.41 MIL/uL — AB (ref 3.87–5.11)
RDW: 15.3 % (ref 11.5–15.5)
WBC: 11.2 10*3/uL — ABNORMAL HIGH (ref 4.0–10.5)

## 2016-10-19 MED ORDER — MAGNESIUM OXIDE 400 (241.3 MG) MG PO TABS
200.0000 mg | ORAL_TABLET | Freq: Two times a day (BID) | ORAL | Status: DC
  Administered 2016-10-19 – 2016-10-23 (×9): 200 mg via ORAL
  Filled 2016-10-19 (×11): qty 0.5

## 2016-10-19 MED ORDER — POLYSACCHARIDE IRON COMPLEX 150 MG PO CAPS
150.0000 mg | ORAL_CAPSULE | Freq: Two times a day (BID) | ORAL | Status: DC
  Administered 2016-10-19 – 2016-10-23 (×8): 150 mg via ORAL
  Filled 2016-10-19 (×11): qty 1

## 2016-10-19 NOTE — Lactation Note (Signed)
This note was copied from a baby's chart. Lactation Consultation Note  Patient Name: Maria HoffGirlB Danyka Femia ONGEX'BToday's Date: 10/19/2016  Follow up visit made.  Mom very pleased that baby B girl is latching easily and milk supply is increasing with pumping.  Baby continues to receive supplement after breastfeeding.  Encouraged to call for assist/concerns.   Maternal Data    Feeding    LATCH Score/Interventions                      Lactation Tools Discussed/Used     Consult Status      Huston FoleyMOULDEN, Demica Zook S 10/19/2016, 3:19 PM

## 2016-10-19 NOTE — Progress Notes (Addendum)
POSTOPERATIVE DAY # 2 S/P CS twins - twin B in room /twinA NICU   S:         Reports feeling ok - sore             Tolerating po intake / no nausea / no vomiting / + flatus / no BM             Bleeding is light             Pain controlled with oxycodone - will add  motrin             Up ad lib / ambulatory/ voiding QS  Newborns breast and formula feeding  / twin A NICU   O:  VS: BP 110/82 (BP Location: Right Arm)   Pulse 97   Temp 98.4 F (36.9 C) (Oral)   Resp 18   Wt 94.9 kg (209 lb 4 oz)   SpO2 98%   Breastfeeding? Unknown   BMI 30.02 kg/m    LABS:               Recent Labs  10/18/16 0525 10/19/16 0541  WBC 12.7* 11.2*  HGB 7.8* 7.4*  PLT 91* 129*               Bloodtype: --/--/O POS, O POS (10/17 0725)  Rubella: Immune (04/20 0000)                                             I&O: Intake/Output      10/19 0701 - 10/20 0700 10/20 0701 - 10/21 0700   P.O. 3190 450   I.V. (mL/kg) 300 (3.2)    Total Intake(mL/kg) 3490 (36.8) 450 (4.7)   Urine (mL/kg/hr) 5300 (2.3) 1800 (3.8)   Stool 1 (0)    Blood     Total Output 5301 1800   Net -1811 -1350                   Physical Exam:             Alert and Oriented X3  Lungs: Clear and unlabored  Heart: regular rate and rhythm / no mumurs  Abdomen: soft, non-tender, non-distended             Fundus: firm, non-tender, Ueven             Dressing intact              Incision:  approximated with suture / no erythema / no ecchymosis / no drainage  Perineum: intact  Lochia: light  Extremities: trace edema, no calf pain or tenderness, neg Homans  A:        POD # 1 S/P CS twins            IDA with ABL anemia - stable  P:        Routine postoperative care              Iron and magnesium PO             Add back motrin - PLT stable     Maria Prince, Maria Prince CNM, MSN, FACNM 10/19/2016, 11:57 AM

## 2016-10-20 MED ORDER — ZOLPIDEM TARTRATE 5 MG PO TABS
5.0000 mg | ORAL_TABLET | Freq: Once | ORAL | Status: AC
  Administered 2016-10-20: 5 mg via ORAL
  Filled 2016-10-20: qty 1

## 2016-10-20 MED ORDER — NIFEDIPINE ER OSMOTIC RELEASE 30 MG PO TB24
30.0000 mg | ORAL_TABLET | Freq: Every day | ORAL | Status: DC
  Administered 2016-10-21 – 2016-10-23 (×3): 30 mg via ORAL
  Filled 2016-10-20 (×3): qty 1

## 2016-10-20 NOTE — Progress Notes (Signed)
POSTOPERATIVE DAY # 3 S/P CS / severe PEC  S:    C/o extreme fatigue - tearful with acute anxiety all day about DC         Denies PIH symptoms         requesting something to help sleep   O:  VS: BP (!) 138/98 (BP Location: Right Arm)   Pulse 95   Temp 97.5 F (36.4 C) (Axillary)   Resp 16   Wt 88.5 kg (195 lb 0.1 oz)   SpO2 97%   Breastfeeding? Unknown   BMI 27.98 kg/m    152/103 - 138/91 - 144/93 - 152/98   LABS:               Recent Labs  10/18/16 0525 10/19/16 0541  WBC 12.7* 11.2*  HGB 7.8* 7.4*  PLT 91* 129*               Bloodtype: --/--/O POS, O POS (10/17 0725)  Rubella: Immune (04/20 0000)                     A:        POD # 3 S/P CS            Elevated BP - per Dr Ernestina PennaFogleman - add Procardia 30 XL  P:        Routine postoperative care              Repeat labs in AM             Twin B in room to nursery for maternal sleep - Ambien 5mg  PO for sleep     Maria Prince, Maria Prince CNM, MSN, [facility] 10/20/2016, 2130pm

## 2016-10-20 NOTE — Progress Notes (Signed)
Postop day #4 status post primary cesarean section for twins with malpresentation of baby B; preeclampsia  Subjective: Patient notes good appetite, no vaginal bleeding, pain well-controlled with by mouth medicines. Patient reports poor sleep last night and increased anxiety over baby in the NICU and going home. Patient has been reluctant to remove her pressure dressing. Patient reports no scotomata, no right upper quadrant pain. Patient reports headache this a.m. which has resolved without meds. Patient is pumping and breast-feeding with good production.  Objective:  Vitals:   10/20/16 0609 10/20/16 0716 10/20/16 0737 10/20/16 0911  BP: (!) 151/99  (!) 159/94 (!) 138/98  Pulse: 84   95  Resp: 15   16  Temp: 98.5 F (36.9 C)   97.5 F (36.4 C)  TempSrc:    Axillary  SpO2: 100%   97%  Weight:  88.5 kg (195 lb 0.1 oz)     Gen.: Slightly anxious, no distress Cardiovascular: Regular rate and rhythm Pulmonary: Clear to auscultation bilaterally Abdomen, soft, properly tender, nondistended fundus at the umbilicus and nontender Incision: Pressure dressing, no staining Lower extremity 2+ edema, nontender GU: No staining  CBC Latest Ref Rng & Units 10/19/2016 10/18/2016 10/17/2016  WBC 4.0 - 10.5 K/uL 11.2(H) 12.7(H) 13.5(H)  Hemoglobin 12.0 - 15.0 g/dL 7.4(L) 7.8(L) 8.3(L)  Hematocrit 36.0 - 46.0 % 21.2(L) 21.8(L) 24.2(L)  Platelets 150 - 400 K/uL 129(L) 91(L) 92(L)   Assessment and plan: 30 year old G1 postop day 4 from primary cesarean section for twin pregnancy with preeclampsia Postoperative. Patient is doing well from a postop standpoint. She is tolerating food and her pain is controlled with by mouth meds. Preeclampsia. Labs suggest improvement with return of platelets and creatinine. Patient still with lower extremity edema and new blood pressure elevations over the last 24 hours. We'll continue to evaluate blood pressures through the day today. If they remain elevated would add  Procardia prior to discharge. Plan for outpatient visit early to mid next week for repeat evaluation. Patient is aware that should she have headache unresolved with meds or scotomata she'll need an urgent evaluation Anemia. Patient to be discharged on iron. Importance of stool softeners also discussed with patient  Donyell Ding A. 10/20/2016 1:23 PM

## 2016-10-20 NOTE — Lactation Note (Signed)
This note was copied from a baby's chart. Lactation Consultation Note  Patient Name: Maria Prince ZOXWR'U Date: 10/20/2016 Reason for consult: Follow-up assessment Lactation to see mom & [name] in room for follow-up assessment. Mom had baby on left breast in cross-cradle hold when LC entered so did not see initial latch. Baby's stomach was facing the ceiling; suggested mom roll baby to be turned in towards her so her ear, shoulder, & hip are in a straight line. Also showed mom how to apply a little pressure on baby's chin to deepen the latch & how to uncurl the top lip. Swallows were noted but baby needed frequent stimulation to keep suckling. Mom's breasts are filling; mom reports she had not pumped this morning but planned to after BF. Mom reports she has been BF q 3 hrs or with feeding cues if sooner & then pumps afterwards. Mom reports she got ~1 oz last pumping session. Encouraged mom to continue routine: BF on both breasts, pump for 15-20 mins and follow with ~5 mins of hand expression. Mom has been sending any pumped milk to NICU for [name] and then they have been supplementing [name] with Neosure after BF. Mom reports [name] has had a harder time latching to mom's right breast so suggested laid-back breastfeeding positions and to continue alternating which breast she breastfeeds on first. [name] came off the breast & they wanted to try laid-back breastfeeding while LC in room so showed mom how to but baby would not wake up. Mom's left nipple had a blister on it - encouraged mom to continue ensuring baby has a deep latch while BF.  Mom reports she has been putting [name] skin-to-skin when in the NICU and last night she expressed some milk while [name] was near her nipple and baby latched on for a little but did not stay on long. Encouraged mom to continue trying.  Mom has a Medela personal pump at home; encouraged mom to bring her pump parts when visiting the  NICU so she can use the [employer] pump as well. Mom has appt with Darlin Priestly, RN, IBCLC on Monday when seeing her pediatrician. Reminded mom of out-patient number & support groups. Mom reports no questions at this time.  Maternal Data    Feeding Feeding Type: Breast Fed Length of feed: 20 min  LATCH Score/Interventions Latch: Grasps breast easily, tongue down, lips flanged, rhythmical sucking.  Audible Swallowing: A few with stimulation  Type of Nipple: Everted at rest and after stimulation  Comfort (Breast/Nipple): Filling, red/small blisters or bruises, mild/mod discomfort     Hold (Positioning): Assistance needed to correctly position infant at breast and maintain latch. Intervention(s): Support Pillows;Breastfeeding basics reviewed;Position options;Skin to skin  LATCH Score: 7  Lactation Tools Discussed/Used     Consult Status Consult Status: Follow-up Date: 10/21/16 Follow-up type: In-patient    Oneal Grout 10/20/2016, 11:05 AM

## 2016-10-21 LAB — CBC
HCT: 23 % — ABNORMAL LOW (ref 36.0–46.0)
Hemoglobin: 7.7 g/dL — ABNORMAL LOW (ref 12.0–15.0)
MCH: 30.4 pg (ref 26.0–34.0)
MCHC: 33.5 g/dL (ref 30.0–36.0)
MCV: 90.9 fL (ref 78.0–100.0)
Platelets: 214 10*3/uL (ref 150–400)
RBC: 2.53 MIL/uL — ABNORMAL LOW (ref 3.87–5.11)
RDW: 16 % — ABNORMAL HIGH (ref 11.5–15.5)
WBC: 12 10*3/uL — ABNORMAL HIGH (ref 4.0–10.5)

## 2016-10-21 LAB — COMPREHENSIVE METABOLIC PANEL
ALT: 96 U/L — ABNORMAL HIGH (ref 14–54)
AST: 103 U/L — ABNORMAL HIGH (ref 15–41)
Albumin: 2.2 g/dL — ABNORMAL LOW (ref 3.5–5.0)
Alkaline Phosphatase: 135 U/L — ABNORMAL HIGH (ref 38–126)
Anion gap: 4 — ABNORMAL LOW (ref 5–15)
BUN: 9 mg/dL (ref 6–20)
CO2: 26 mmol/L (ref 22–32)
Calcium: 8.2 mg/dL — ABNORMAL LOW (ref 8.9–10.3)
Chloride: 109 mmol/L (ref 101–111)
Creatinine, Ser: 0.53 mg/dL (ref 0.44–1.00)
GFR calc Af Amer: >60 mL/min (ref >60)
GFR calc non Af Amer: >60 mL/min (ref >60)
Glucose, Bld: 98 mg/dL (ref 65–99)
Potassium: 4.8 mmol/L (ref 3.5–5.1)
Sodium: 139 mmol/L (ref 135–145)
Total Bilirubin: 0.7 mg/dL (ref 0.3–1.2)
Total Protein: 5 g/dL — ABNORMAL LOW (ref 6.5–8.1)

## 2016-10-21 MED ORDER — MAGNESIUM SULFATE 50 % IJ SOLN
2.0000 g/h | INTRAMUSCULAR | Status: DC
  Administered 2016-10-22: 2 g/h via INTRAVENOUS
  Filled 2016-10-21 (×2): qty 80

## 2016-10-21 MED ORDER — MAGNESIUM SULFATE BOLUS VIA INFUSION
4.0000 g | Freq: Once | INTRAVENOUS | Status: AC
  Administered 2016-10-21: 4 g via INTRAVENOUS
  Filled 2016-10-21: qty 500

## 2016-10-21 MED ORDER — DIPHENHYDRAMINE-ZINC ACETATE 2-0.1 % EX CREA
1.0000 "application " | TOPICAL_CREAM | Freq: Two times a day (BID) | CUTANEOUS | Status: DC | PRN
  Administered 2016-10-21: 1 via TOPICAL
  Filled 2016-10-21: qty 28

## 2016-10-21 MED ORDER — ZOLPIDEM TARTRATE 5 MG PO TABS
5.0000 mg | ORAL_TABLET | Freq: Every day | ORAL | Status: DC
  Administered 2016-10-22: 5 mg via ORAL
  Filled 2016-10-21: qty 1

## 2016-10-21 MED ORDER — LACTATED RINGERS IV SOLN
INTRAVENOUS | Status: DC
  Administered 2016-10-21: 12:00:00 via INTRAVENOUS

## 2016-10-21 NOTE — Progress Notes (Addendum)
POSTOPERATIVE DAY # 4 S/P CS- twins    S:         Reports feeling better - slept after Ambien last night with newborn in nursery             Denies PIH symptoms              Tolerating po intake / no nausea / no vomiting / + flatus / no BM             Bleeding is light             Pain controlled with motrin and oxycodone             Up ad lib / ambulatory/ voiding QS  Newborns formula feeding   O:  VS: BP 130/88 (BP Location: Right Arm)   Pulse 96   Temp 98 F (36.7 C)   Resp 18   Wt 86.2 kg (190 lb)   SpO2 99%   Breastfeeding? Unknown   BMI 27.26 kg/m    BP:  130/88 (procardia this am)   151/100 - 148/95 - 152/89 - 152/98  LABS:            10/18    10/19    10/22 AST:     27         23        103 ALT:     12         12          96                Recent Labs  10/19/16 0541 10/21/16 0756  WBC 11.2* 12.0*  HGB 7.4* 7.7*  PLT 129* 214               Bloodtype: --/--/O POS, O POS (10/17 0725)  Rubella: Immune (04/20 0000)                                 Physical Exam:             Alert and Oriented X3  Lungs: Clear and unlabored  Heart: regular rate and rhythm / no mumurs  Abdomen: soft, non-tender, non-distended, active BS             Fundus: firm, non-tender, U-1             Dressing intact honeycomb              Incision:  approximated with suture / no erythema / no ecchymosis / no drainage  Perineum: intact  Lochia: light to scant  Extremities: 1+ dependent edema, no calf pain or tenderness, negative Homans  A:        POD # 4 S/P CS - twins            ABL anemia s/p transfusion - stable            preeclampsia              development of severe postpartum preeclampsia as evidenced by significant hypertension and elevation in LE  P:        Routine postoperative care              Consult Dr Billy Coastaavon                   -agree with Procardia 30Xl                   -  Magnesium sulfate prophylaxis x 24 hours                  - monitor labs     Marlinda Mike  CNM, MSN, [facility] 10/21/2016, 10:56 AM   Pt seen and examined.  Agree with plan of care.

## 2016-10-21 NOTE — Plan of Care (Signed)
Problem: Education: Goal: Knowledge of disease or condition will improve Outcome: Progressing Second time this patient was put on magnesium drip for preeclampsia after CMP result came in AM with elevated LFT.  Tanya CNM and Dr. Henderson Cloudomblin came to talk to patient and answered most of the couples questions and eased their doubts and fears.   Goal: Knowledge of the prescribed therapeutic regimen will improve Outcome: Progressing Couple fully aware of therapeutic regimen and cooperative and complaint with care.

## 2016-10-21 NOTE — Lactation Note (Signed)
This note was copied from a baby's chart. Lactation Consultation Note  Patient Name: Maria Prince ZOXWR'UToday's Date: 10/21/2016 Reason for consult: Follow-up assessment;Multiple gestation;Infant < 6lbs;NICU baby;Late preterm infant   Follow up with mom of twins, one twin is with mom and one twin in NICU. Mom reports she slept all last night and [name] was in the nursery. Discussed with mom to allow herself 6 hours to sleep at night. Enc mom to pump at least 8 x a day. She is getting ready to pump. Her breast are full to touch this am, she does not appear to be engorged. Engorgement prevention/treatment reviewed.   Advised mom to continue putting infants to breast as much as possible and to maintain supplement [facility] or Ped determine infants are getting enough from the breast. Mom and dad voiced understanding. [facility]. Mom is aware of OP services, BF Support Groups and LC phone #. Reviewed all Bf information in Taking Care of Baby and me Booklet. Enc parents to call with questions/concerns prn and for feeding assistance of [name] in NICU as needed. Mom has a PIS at home for use and is aware of pumping rooms in NICU to pump when visiting. Breastmilk storage reviewed for infant at home and infant in NICU.    Maternal Data Formula Feeding for Exclusion: No Has patient been taught Hand Expression?: Yes Does the patient have breastfeeding experience prior to this delivery?: No  Feeding Feeding Type: Formula Nipple Type: Slow - flow Length of feed: 40 min  LATCH Score/Interventions                      Lactation Tools Discussed/Used WIC Program: No Pump Review: Setup, frequency, and cleaning;Milk Storage (for home and NICU infant) Initiated by:: Reviewed   Consult Status Consult Status: PRN Follow-up type: Call as needed    Ed BlalockSharon S Lovell Roe 10/21/2016, 8:37 AM

## 2016-10-21 NOTE — Plan of Care (Signed)
Problem: Activity: Goal: Will verbalize the importance of balancing activity with adequate rest periods Outcome: Completed/Met Date Met: 10/21/16 Encouraged rest and good sleep especially BP was elevated. Patient verbalized not sleeping good since baby is to be fed every 3 hrs due to weight. Ambien 5 mgs as ordered and baby brought to nursery in between feeding times. Goal: Ability to tolerate increased activity will improve Outcome: Completed/Met Date Met: 10/21/16 Ambulates in the room without assist. Ambulates in hallways with standby assist.  Problem: Education: Goal: Knowledge of condition will improve Outcome: Progressing Patient was tearful for not being able to go home due to elevated BP and verbalize understanding that her condition will improve if she rest better to improve her BP.  Problem: Coping: Goal: Ability to cope will improve Outcome: Progressing Encouraged to look into support groups for new mothers especially with twin children.  Goal: Ability to identify and utilize available resources and services will improve Outcome: Completed/Met Date Met: 10/21/16 Patient claims she is aware of support groups she would like to join to meet her needs.  Problem: Life Cycle: Goal: Risk for postpartum hemorrhage will decrease Outcome: Completed/Met Date Met: 10/21/16 Vaginal bleeding scant, uterus firm and contracted. Goal: Chance of risk for complications during the postpartum period will decrease Outcome: Completed/Met Date Met: 10/21/16 Postpartum hemorrhage resolved  Problem: Nutritional: Goal: Dietary intake will improve Outcome: Completed/Met Date Met: 10/21/16 Appetite improved.  Good intake. Goal: Mother's verbalization of comfort with breastfeeding process will improve Outcome: Completed/Met Date Met: 10/21/16 Latch score 10. Breastfeeding without assist but patient claims she is feeling tired as baby is cluster feeding. Also supplementing with formula.  Patient  requested baby to nursery for tonight as her blood pressure was elevated.  Problem: Role Relationship: Goal: Ability to demonstrate positive interaction with newborn will improve Outcome: Completed/Met Date Met: 10/21/16 Incorporating baby with family unit.  Baby A still in NICU.  Problem: Pain Management: Goal: General experience of comfort will improve and pain level will decrease Outcome: Completed/Met Date Met: 10/21/16 With good pain control with analgesia of choice.  Problem: Bowel/Gastric: Goal: Gastrointestinal status will improve Outcome: Completed/Met Date Met: 10/21/16 Good bowel sounds.  Passing flatus. Lat bowel movemet 10/21  Problem: Respiratory: Goal: Ability to maintain adequate ventilation will improve Outcome: Completed/Met Date Met: 10/21/16 Lungs clear.  Breathing unlabored.  Problem: Urinary Elimination: Goal: Ability to reestablish a normal urinary elimination pattern will improve Outcome: Completed/Met Date Met: 10/21/16 Voiding without any problem.

## 2016-10-22 ENCOUNTER — Encounter (HOSPITAL_COMMUNITY): Payer: Self-pay | Admitting: *Deleted

## 2016-10-22 LAB — COMPREHENSIVE METABOLIC PANEL
ALT: 65 U/L — ABNORMAL HIGH (ref 14–54)
AST: 51 U/L — ABNORMAL HIGH (ref 15–41)
Albumin: 2.1 g/dL — ABNORMAL LOW (ref 3.5–5.0)
Alkaline Phosphatase: 120 U/L (ref 38–126)
Anion gap: 5 (ref 5–15)
BUN: 10 mg/dL (ref 6–20)
CO2: 27 mmol/L (ref 22–32)
Calcium: 7.3 mg/dL — ABNORMAL LOW (ref 8.9–10.3)
Chloride: 105 mmol/L (ref 101–111)
Creatinine, Ser: 0.55 mg/dL (ref 0.44–1.00)
GFR calc Af Amer: >60 mL/min (ref >60)
GFR calc non Af Amer: >60 mL/min (ref >60)
Glucose, Bld: 109 mg/dL — ABNORMAL HIGH (ref 65–99)
Potassium: 4.2 mmol/L (ref 3.5–5.1)
Sodium: 137 mmol/L (ref 135–145)
Total Bilirubin: 0.7 mg/dL (ref 0.3–1.2)
Total Protein: 4.7 g/dL — ABNORMAL LOW (ref 6.5–8.1)

## 2016-10-22 LAB — CBC
HCT: 22 % — ABNORMAL LOW (ref 36.0–46.0)
Hemoglobin: 7.5 g/dL — ABNORMAL LOW (ref 12.0–15.0)
MCH: 30.9 pg (ref 26.0–34.0)
MCHC: 34.1 g/dL (ref 30.0–36.0)
MCV: 90.5 fL (ref 78.0–100.0)
Platelets: 214 10*3/uL (ref 150–400)
RBC: 2.43 MIL/uL — ABNORMAL LOW (ref 3.87–5.11)
RDW: 16.7 % — ABNORMAL HIGH (ref 11.5–15.5)
WBC: 11.3 10*3/uL — ABNORMAL HIGH (ref 4.0–10.5)

## 2016-10-22 MED ORDER — SODIUM CHLORIDE 0.9% FLUSH
3.0000 mL | Freq: Two times a day (BID) | INTRAVENOUS | Status: DC
  Administered 2016-10-22 (×2): 3 mL via INTRAVENOUS

## 2016-10-22 MED ORDER — ACETAMINOPHEN 325 MG PO TABS
650.0000 mg | ORAL_TABLET | Freq: Four times a day (QID) | ORAL | Status: DC | PRN
  Administered 2016-10-22: 650 mg via ORAL
  Filled 2016-10-22: qty 2

## 2016-10-22 NOTE — Progress Notes (Addendum)
Patient ID: Maria Prince, female   DOB: Dec 14, 1986, 30 y.o.   MRN: [id number] Subjective: S/P Primary Cesarean Delivery / Twins / Malpresentation of twin B POD# 5 Information for the patient's newborn:  Maria Prince, GirlA Maria Prince [[id number]][[id number]]  Female "Maria Prince" in NICU Information for the patient's newborn:  Maria Prince, GirlB Maria Prince [[id number]][[id number]]  Female "Maria Prince"   Reports feeling very tired and drained from magnesium infusion. Feeding: breast and bottle Patient reports tolerating PO.  Breast symptoms: [facility]'s disinterest in nursing Pain controlled with ibuprofen (OTC) and narcotic analgesics including Percocet Denies HA/SOB/C/P/N/V/dizziness. Flatus present. (+) BM. She reports vaginal bleeding as normal, without clots.  She is ambulating, urinating without difficult.     Objective:   VS:  Vitals:   10/22/16 0800 10/22/16 0845 10/22/16 0930 10/22/16 1011  BP: 132/89     Pulse: 86     Resp: 16 16 18 18   Temp: 98.1 F (36.7 C)     TempSrc: Oral     SpO2: 100%     Weight:      Height:         Intake/Output Summary (Last 24 hours) at 10/22/16 1049 Last data filed at 10/22/16 1011  Gross per 24 hour  Intake             2885 ml  Output             8100 ml  Net            -5215 ml       Recent Labs  10/21/16 0756 10/22/16 0604  WBC 12.0* 11.3*  HGB 7.7* 7.5*  HCT 23.0* 22.0*  PLT 214 214     Blood type: --/--/O POS, O POS (10/17 0725)  Rubella: Immune (04/20 0000)     Physical Exam:   General: alert, cooperative, fatigued and no distress  Abdomen: soft, nontender, normal bowel sounds  Incision: clean, dry, intact and skin well-approximated with sutures  Uterine Fundus: firm, 2 FB below umbilicus, nontender  Lochia: none  Ext: edema trace and Homans sign is negative, no sign of DVT   Assessment/Plan: 30 y.o.   POD# 5.  S/P Cesarean Delivery.  Indications: twins / malpresentation of twin B                Principal Problem:   Postpartum care following cesarean  delivery (10/17) Active Problems:   Premature rupture of membranes Pre-Eclampsia, postpartum - BP's stable, PIH labs trending down  Doing well, stable.               Regular diet as tolerated D/C Magnesium Sulfate at noon Routine post-op care Anticipate D/C home tomorrow   Maria Prince, Maria Prince, Maria Prince, Maria Prince, Maria Prince 10/22/2016, 10:49 AM  On call physician: pt seen. BP reviewed. 4.5L output. Excellent diuresing D/c magnesium Anticipate d/c home tomorrow Would not repeat labs as trending appropriately.  F/u BP in office on Friday and use smart start to check BP in the interim Resume [email or web address] at home. Ambulate. Ted stocking Warning signs for preeclampsia reviewed Disc setting expectations. Pt's mother will help her out( retired)

## 2016-10-22 NOTE — Lactation Note (Signed)
This note was copied from a baby's chart. Lactation Consultation Note  Patient Name: Maria Prince Rielly Flagg ZHYQM'VToday's Date: 10/22/2016 Reason for consult: Follow-up assessment;NICU baby;Infant < 6lbs;Late preterm infant;Multiple gestation   Follow up with parents of LPT twins. [name] "twin A" is in room with parents. Mom reports she recently attempted to BF infant and she will not latch. Assisted mom in latching [name] to breast and she latched and took a few swallows and fell asleep. Mom reports they are attempting to feed infants at breast with feeding before supplementation. Reviewed BF basics and positioning with mom. Enc mom to massage/compress breast with feeding.   Mom reports she is feeling overwhelmed and the MgSO4 is making her sleepy. Discussed normal BF behavior for LPT infants. Enc them to feed infants for 15-20 minutes at breast and then to supplement them immediately with formula or EBM. Then enc mom to pump 8-12 x a day after BF. Enc mom to take a 6 hour stretch at night and allow FOB to feed infants in the middle of the night. Also encouraged naps between feeding infants and pumpings. Enc parents to maintain feeding log.   Follow up tomorrow and prn.     Maternal Data Formula Feeding for Exclusion: No Has patient been taught Hand Expression?: Yes Does the patient have breastfeeding experience prior to this delivery?: No  Feeding Feeding Type: Breast Fed Nipple Type: Slow - flow Length of feed: 2 min  LATCH Score/Interventions Latch: Repeated attempts needed to sustain latch, nipple held in mouth throughout feeding, stimulation needed to elicit sucking reflex. Intervention(s): Skin to skin;Teach feeding cues;Waking techniques Intervention(s): Adjust position;Assist with latch;Breast massage;Breast compression  Audible Swallowing: None  Type of Nipple: Everted at rest and after stimulation  Comfort (Breast/Nipple): Soft / non-tender     Hold (Positioning): Assistance  needed to correctly position infant at breast and maintain latch. Intervention(s): Breastfeeding basics reviewed;Support Pillows;Position options;Skin to skin  LATCH Score: 6  Lactation Tools Discussed/Used WIC Program: No Pump Review: Setup, frequency, and cleaning   Consult Status Consult Status: Follow-up Date: 10/23/16 Follow-up type: In-patient    Silas FloodSharon S Jguadalupe Opiela 10/22/2016, 11:03 AM

## 2016-10-23 DIAGNOSIS — O1495 Unspecified pre-eclampsia, complicating the puerperium: Secondary | ICD-10-CM | POA: Diagnosis not present

## 2016-10-23 MED ORDER — SIMETHICONE 80 MG PO CHEW: 80.0000 mg | CHEWABLE_TABLET | ORAL | 0 refills | Status: DC | PRN

## 2016-10-23 MED ORDER — ACETAMINOPHEN 325 MG PO TABS: 650.0000 mg | ORAL_TABLET | Freq: Four times a day (QID) | ORAL | Status: AC | PRN

## 2016-10-23 MED ORDER — MAGNESIUM OXIDE 400 (241.3 MG) MG PO TABS: 200.0000 mg | ORAL_TABLET | Freq: Two times a day (BID) | ORAL | Status: DC

## 2016-10-23 MED ORDER — COCONUT OIL OIL: 1.0000 "application " | TOPICAL_OIL | 0 refills | Status: DC | PRN

## 2016-10-23 MED ORDER — IBUPROFEN 600 MG PO TABS: 600.0000 mg | ORAL_TABLET | Freq: Four times a day (QID) | ORAL | 0 refills | Status: DC

## 2016-10-23 MED ORDER — NIFEDIPINE ER 30 MG PO TB24: 30.0000 mg | ORAL_TABLET | Freq: Every day | ORAL | 0 refills | Status: DC

## 2016-10-23 MED ORDER — OXYCODONE HCL 10 MG PO TABS: 10.0000 mg | ORAL_TABLET | ORAL | 0 refills | Status: DC | PRN

## 2016-10-23 NOTE — Progress Notes (Signed)
Patient ID: Maria Prince, female   DOB: 01/27/1986, 30 y.o.   MRN: [id number] Subjective: POD# 6 Information for the patient's newborn:  Maria Prince, GirlA Maria Prince [[id number]][[id number]]  female Information for the patient's newborn:  Maria Prince, GirlB Maria Prince [[id number]][[id number]]  female    Babies names: A - Maria Bergamoharley / B Maria Prince  Reports feeling well, ready for DC home. Feeding: breast and bottle Patient reports tolerating PO.  Breast symptoms: starting to fill up. Pain controlled withPO meds Denies HA/SOB/C/P/N/V/dizziness. Flatus present. She reports vaginal bleeding as normal, without clots.  She is ambulating, urinating without difficulty.     Objective:   VS:    Vitals:   10/22/16 1815 10/22/16 2108 10/23/16 0205 10/23/16 0555  BP: 116/81 140/90 126/75 126/83  Pulse: 94 99 95 91  Resp: 12 16 14 14   Temp: 98.5 F (36.9 C) 98.6 F (37 C) 98.9 F (37.2 C) 98.1 F (36.7 C)  TempSrc: Oral Oral Oral Oral  SpO2: 100% 100% 99% 99%  Weight:    81.5 kg (179 lb 12 oz)  Height:         Intake/Output Summary (Last 24 hours) at 10/23/16 1009 Last data filed at 10/23/16 0614  Gross per 24 hour  Intake          3761.25 ml  Output             6700 ml  Net         -2938.75 ml        Recent Labs  10/21/16 0756 10/22/16 0604  WBC 12.0* 11.3*  HGB 7.7* 7.5*  HCT 23.0* 22.0*  PLT 214 214     Blood type: --/--/O POS, O POS (10/17 0725)  Rubella: Immune (04/20 0000)     Physical Exam:  General: alert, cooperative and no distress CV: Regular rate and rhythm Resp: clear Abdomen: soft, nontender, normal bowel sounds Incision: clean, dry, intact and mild echymosis and swelling Uterine Fundus: firm, below umbilicus, nontender Lochia: minimal Ext: edema + 1 pedal and pretibial, no cords or calf tenderness    Assessment/Plan: 30 y.o.   POD# 6. [id number]                  Principal Problem:   Postpartum care following cesarean delivery (10/17) Indications: twins / malpresentation of twin B     Postpartum  preeclampsia - resolving  - normotensive on Procardia, no neural S/S and diuresing well.  Doing well, stable.  DC home today  Routine post-op care F/u BP in office on Friday and use smart start to check BP in the interim Resume [email or web address] at home. Ambulate. Ted stocking Warning signs for preeclampsia reviewed           Maria Prince, CNM, MSN 10/23/2016, 10:09 AM

## 2016-10-23 NOTE — Discharge Summary (Signed)
OB Discharge Summary     Patient Name: Maria Prince DOB: 1986-10-23 MRN: [id number]  Date of admission: 10/16/2016 Delivering MD:    [name], GirlA Maelin [[id number]][[id number]]  Burna FortsAAVON, RICHARD   Werber, GirlB RapidsErika [[id number]][[id number]]  Olivia MackieAAVON, RICHARD   Date of discharge: 10/23/2016  Admitting diagnosis: 4636 WEEKS CTX, di/di twin gestation Intrauterine pregnancy: 3483w1d     Secondary diagnosis:  Principal Problem:   Postpartum care following cesarean delivery (10/17) Active Problems:   Premature rupture of membranes   Preeclampsia in postpartum period  Additional problems: acute blood loss anemia, stable post transfusion 2 units packed red blood cells     Discharge diagnosis: Preterm Pregnancy Delivered, Preeclampsia (severe), GDM A1, Anemia and PPH                                                                                                Post partum procedures:Magnesium Sulphate neuroprphylaxis  Augmentation: none  Complications: Hemorrhage>102800mL  Hospital course:  Onset of Labor With Unplanned C/S  30 y.o. yo G1P0101 at 7583w1d was admitted in Latent Labor on 10/16/2016. Patient had a labor course significant for preterm rupture of membranes and preterm labor, malpresentation of twin B, pre-eclampsia progressing to severe status in postpartum, and postpartum hemorrhage necessitating transfusion of 4 units packed red blood cells on postpartum day 0, hemoglobin nadir 7.5 on 10/16/16. Membrane Rupture Time/Date:    [name], GirlA Fatima [[id number]][[id number]]  6:00 AM   Leticia ClasCook, GirlB MarbleErika [[id number]][[id number]]  6:00 AM ,   [name], GirlA Netanya [[id number]][[id number]]  10/16/2016   [name], GirlB Sitlali [[id number]][[id number]]  10/16/2016   The patient went for cesarean section due to [facility] Corp[facility]presentation, and delivered  Viable infants,   [name], GirlA Emiah [[id number]][[id number]]  10/16/2016   [name], GirlB Veda [[id number]][[id number]]  10/16/2016  Details of operation can be found in separate operative note. Patient had a postpartum course complicated by severe  acute blood loss anemia, preeclampsia with severe features postpartum with exacerbation of liver enzymes on postpartum day 4 - AST/ALT 103/96necessitating restart of Magnesium Sulfate x 24 hours. Subsequently, patient improved diuresis, blood pressure normalized with Procardia 30 mg PO daily, and liver enzymes decreased AST/ALT 51/65 .  She is ambulating, tolerating a regular diet, passing flatus, and urinating well.  Patient is discharged home in stable condition 10/23/16.   Physical exam Vitals:   10/22/16 2108 10/23/16 0205 10/23/16 0555 10/23/16 1000  BP: 140/90 126/75 126/83 132/83  Pulse: 99 95 91 94  Resp: 16 14 14 16   Temp: 98.6 F (37 C) 98.9 F (37.2 C) 98.1 F (36.7 C) 98.8 F (37.1 C)  TempSrc: Oral Oral Oral Oral  SpO2: 100% 99% 99% 98%  Weight:   81.5 kg (179 lb 12 oz)   Height:       General: alert, cooperative and no distress Lochia: appropriate Uterine Fundus: firm Incision: Healing well with no significant drainage DVT Evaluation: No cords or calf tenderness. Calf/Ankle edema is present Labs: Lab Results  Component Value Date   WBC 11.3 (H) 10/22/2016   HGB 7.5 (L) 10/22/2016   HCT 22.0 (L) 10/22/2016  MCV 90.5 10/22/2016   PLT 214 10/22/2016   CMP Latest Ref Rng & Units 10/22/2016  Glucose 65 - 99 mg/dL 960(A)  BUN 6 - 20 mg/dL 10  Creatinine 5.40 - 9.81 mg/dL 1.91  Sodium 478 - 295 mmol/L 137  Potassium 3.5 - 5.1 mmol/L 4.2  Chloride 101 - 111 mmol/L 105  CO2 22 - 32 mmol/L 27  Calcium 8.9 - 10.3 mg/dL 7.3(L)  Total Protein 6.5 - 8.1 g/dL 4.7(L)  Total Bilirubin 0.3 - 1.2 mg/dL 0.7  Alkaline Phos 38 - 126 U/L 120  AST 15 - 41 U/L 51(H)  ALT 14 - 54 U/L 65(H)    Discharge instruction: Per [facility] instructions booklet  After visit meds:    Medication List    STOP taking these medications   doxylamine (Sleep) 25 MG tablet Commonly known as:  UNISOM   ranitidine 150 MG tablet Commonly known as:  ZANTAC     TAKE these medications    acetaminophen 325 MG tablet Commonly known as:  TYLENOL Take 2 tablets (650 mg total) by mouth every 6 (six) hours as needed for mild pain or moderate pain.   coconut oil Oil Apply 1 application topically as needed.   ibuprofen 600 MG tablet Commonly known as:  ADVIL,MOTRIN Take 1 tablet (600 mg total) by mouth every 6 (six) hours.   IRON PO Take 1 tablet by mouth daily.   magnesium oxide 400 (241.3 Mg) MG tablet Commonly known as:  MAG-OX Take 0.5 tablets (200 mg total) by mouth 2 (two) times daily.   NIFEdipine 30 MG 24 hr tablet Commonly known as:  PROCARDIA-XL/ADALAT CC Take 1 tablet (30 mg total) by mouth daily. Start taking on:  10/24/2016   Oxycodone HCl 10 MG Tabs Take 1 tablet (10 mg total) by mouth every 4 (four) hours as needed for severe pain (pain scale of 8-10).   prenatal multivitamin Tabs tablet Take 1 tablet by mouth daily at 12 noon.   simethicone 80 MG chewable tablet Commonly known as:  MYLICON Chew 1 tablet (80 mg total) by mouth as needed for flatulence.       Diet: routine diet  Activity: Advance as tolerated. Pelvic rest for 6 weeks.   Outpatient follow up: in 3 days at [facility] and Smart Start BP check in interim at home.  Postpartum contraception: Not Discussed  Newborn Data:   Keeley, Sussman [[id number]]  Live born female  Birth Weight: 5 lb 4 oz (2380 g) APGAR: 8, 8   Arrie, Borrelli [[id number]]  Live born female - [name] Birth Weight: 5 lb 0.8 oz (2290 g) APGAR: 7, 8  Baby Feeding: Bottle and Breast Disposition:home with mother   10/23/2016 Neta Mends, CNM

## 2016-10-23 NOTE — Lactation Note (Signed)
This note was copied from a baby's chart. Lactation Consultation Note  Patient Name: Maria Prince ZOXWR'UToday's Date: 10/23/2016 Reason for consult: Follow-up assessment;Infant < 6lbs;Late preterm infant;Multiple gestation   Follow up with mom of LPT twins being d/c today. Mom pleased Twin A is with them and all are going home today.   Twin A-[name]- Weight 5 lb 6.8 oz. Infant with 8 bottle feeds of Neosure 22 cal formula. Parents reports she did recieved 9 and 19 cc EBM yesterday with 2 feeds. Mom reports [name] will latch but will not feed for long at the breast, she stops if infant fussy or 15-20 minutes and then offer her supplement. Infant with 8 voids and 5 stools in last 24 hours.  Twin B.- [name]- Weight 5 lb 0.8 oz. Infant with 6 BF for 15-20 minutes, 7 bottle feeds of 25-50 cc of Neosure 22 cal, 7 voids and 2 stools in last 24 hours. Mom reports she does well with BF and will feed for 15=20 minutes and then they stop her to take her supplement.   Mom reports she has pumped sporadically and is getting about 9-12 cc/pumping that they are feeding to [name]. Mom reports she has not been able to pump more often due to her medical problems. Mom is aware that every 3 hour pumping is recommended. Mom has a PIS for home use.  Infants and mom have an [facility] tomorrow [name] Thursday. Parents are aware of OP services, BF support Groups and LC phone #. Enc parents to call with questions/concerns prn.   Parents are aware to feed infants at least every 3 hours. Attempt BF for 15-20 minutes and then go ahead and supplement. Enc mom to pump every 2-3 hours as able.     Maternal Data Formula Feeding for Exclusion: No Does the patient have breastfeeding experience prior to this delivery?: No  Feeding Feeding Type: Formula Nipple Type: Slow - flow  LATCH Score/Interventions                      Lactation Tools Discussed/Used WIC Program:  No Pump Review: Setup, frequency, and cleaning;Milk Storage   Consult Status Consult Status: Complete Follow-up type: Call as needed    Ed BlalockSharon S Louisa Favaro 10/23/2016, 11:21 AM

## 2016-10-23 NOTE — Discharge Instructions (Signed)
Breast Pumping Tips °If you are breastfeeding, there may be times when you cannot feed your baby directly. Returning to work or going on a trip are common examples. Pumping allows you to store breast milk and feed it to your baby later.  °You may not get much milk when you first start to pump. Your breasts should start to make more after a few days. If you pump at the times you usually feed your baby, you may be able to keep making enough milk to feed your baby without also using formula. The more often you pump, the more milk you will produce.  °WHEN SHOULD I PUMP?  °· You can begin to pump soon after delivery. However, some experts recommend waiting about 4 weeks before giving your infant a bottle to make sure breastfeeding is going well.  °· If you plan to return to work, begin pumping a few weeks before. This will help you develop techniques that work best for you. It also lets you build up a supply of breast milk.   °· When you are with your infant, feed on demand and pump after each feeding.   °· When you are away from your infant for several hours, pump for about 15 minutes every 2-3 hours. Pump both breasts at the same time if you can.   °· If your infant has a formula feeding, make sure to pump around the same time.     °· If you drink any alcohol, wait 2 hours before pumping.   °HOW DO I PREPARE TO PUMP? °Your let-down reflex is the natural reaction to stimulation that makes your breast milk flow. It is easier to stimulate this reflex when you are relaxed. Find relaxation techniques that work for you. If you have difficulty with your let-down reflex, try these methods:  °· Smell one of your infant's blankets or an item of clothing.   °· Look at a picture or video of your infant.   °· Sit in a quiet, private space.   °· Massage the breast you plan to pump.   °· Place soothing warmth on the breast.   °· Play relaxing music.   °WHAT ARE SOME GENERAL BREAST PUMPING TIPS? °· Wash your hands before you pump. You  do not need to wash your nipples or breasts. °· There are three ways to pump. °¨ You can use your hand to massage and compress your breast. °¨ You can use a handheld manual pump. °¨ You can use an electric pump.   °· Make sure the suction cup (flange) on the breast pump is the right size. Place the flange directly over the nipple. If it is the wrong size or placed the wrong way, it may be painful and cause nipple damage.   °· If pumping is uncomfortable, apply a small amount of purified or modified lanolin to your nipple and areola. °· If you are using an electric pump, adjust the speed and suction power to be more comfortable. °· If pumping is painful or if you are not getting very much milk, you may need a different type of pump. A lactation consultant can help you determine what type of pump to use.   °· Keep a full water bottle near you at all times. Drinking lots of fluid helps you make more milk.  °· You can store your milk to use later. Pumped breast milk can be stored in a sealable, sterile container or plastic bag. Label all stored breast milk with the date you pumped it. °¨ Milk can stay out at room temperature for up to 8 hours. °¨   You can store your milk in the refrigerator for up to 8 days. °¨ You can store your milk in the freezer for 3 months. Thaw frozen milk using warm water. Do not put it in the microwave. °· Do not smoke. Smoking can lower your milk supply and harm your infant. If you need help quitting, ask your health care provider to recommend a program.   °WHEN SHOULD I CALL MY HEALTH CARE PROVIDER OR A LACTATION CONSULTANT? °· You are having trouble pumping. °· You are concerned that you are not making enough milk. °· You have nipple pain, soreness, or redness. °· You want to use birth control. Birth control pills may lower your milk supply. Talk to your health care provider about your options. °  °This information is not intended to replace advice given to you by your health care provider.  Make sure you discuss any questions you have with your health care provider. °  °Document Released: 06/06/2010 Document Revised: 12/22/2013 Document Reviewed: 10/09/2013 °Elsevier Interactive Patient Education ©2016 [employer] ° °

## 2016-10-26 DIAGNOSIS — O1495 Unspecified pre-eclampsia, complicating the puerperium: Secondary | ICD-10-CM | POA: Diagnosis not present

## 2016-11-08 DIAGNOSIS — Z3483 Encounter for supervision of other normal pregnancy, third trimester: Secondary | ICD-10-CM | POA: Diagnosis not present

## 2016-11-08 DIAGNOSIS — Z3482 Encounter for supervision of other normal pregnancy, second trimester: Secondary | ICD-10-CM | POA: Diagnosis not present

## 2016-11-29 DIAGNOSIS — O9081 Anemia of the puerperium: Secondary | ICD-10-CM | POA: Diagnosis not present

## 2016-11-29 DIAGNOSIS — Z6824 Body mass index (BMI) 24.0-24.9, adult: Secondary | ICD-10-CM | POA: Diagnosis not present

## 2017-05-01 ENCOUNTER — Encounter (HOSPITAL_COMMUNITY): Payer: Self-pay | Admitting: Obstetrics and Gynecology

## 2017-07-22 IMAGING — DX DG KNEE COMPLETE 4+V*L*
4 series · 4 of 4 positions shown · non-contrast
Comparison: None.

CLINICAL DATA: Status post fall today with pain in the left knee.

EXAM:
LEFT KNEE - COMPLETE 4+ VIEW

[knee ap]
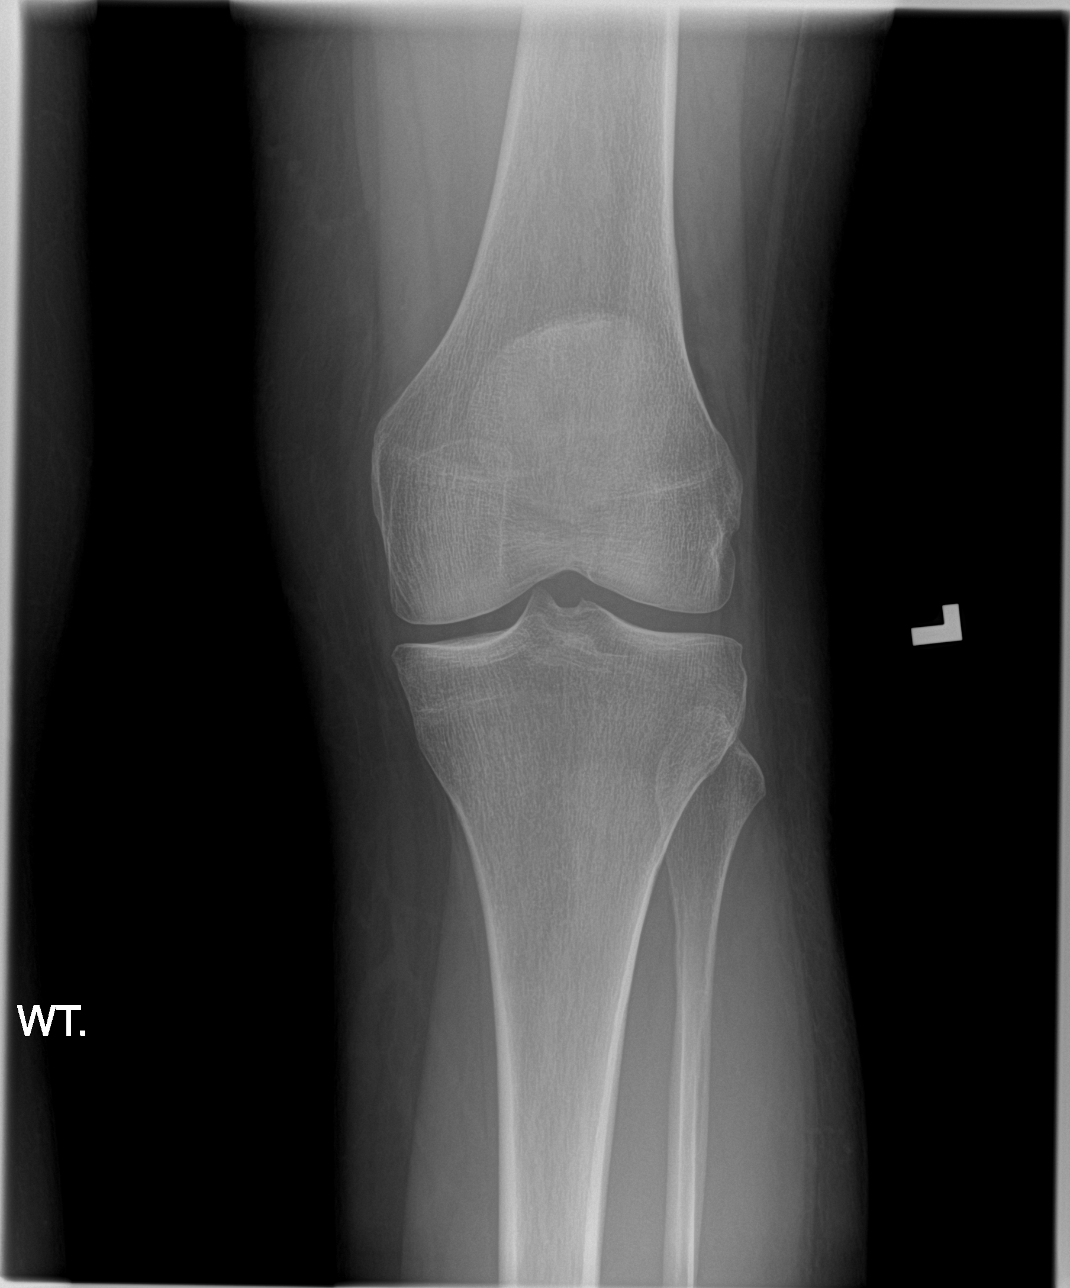

[tunnel]
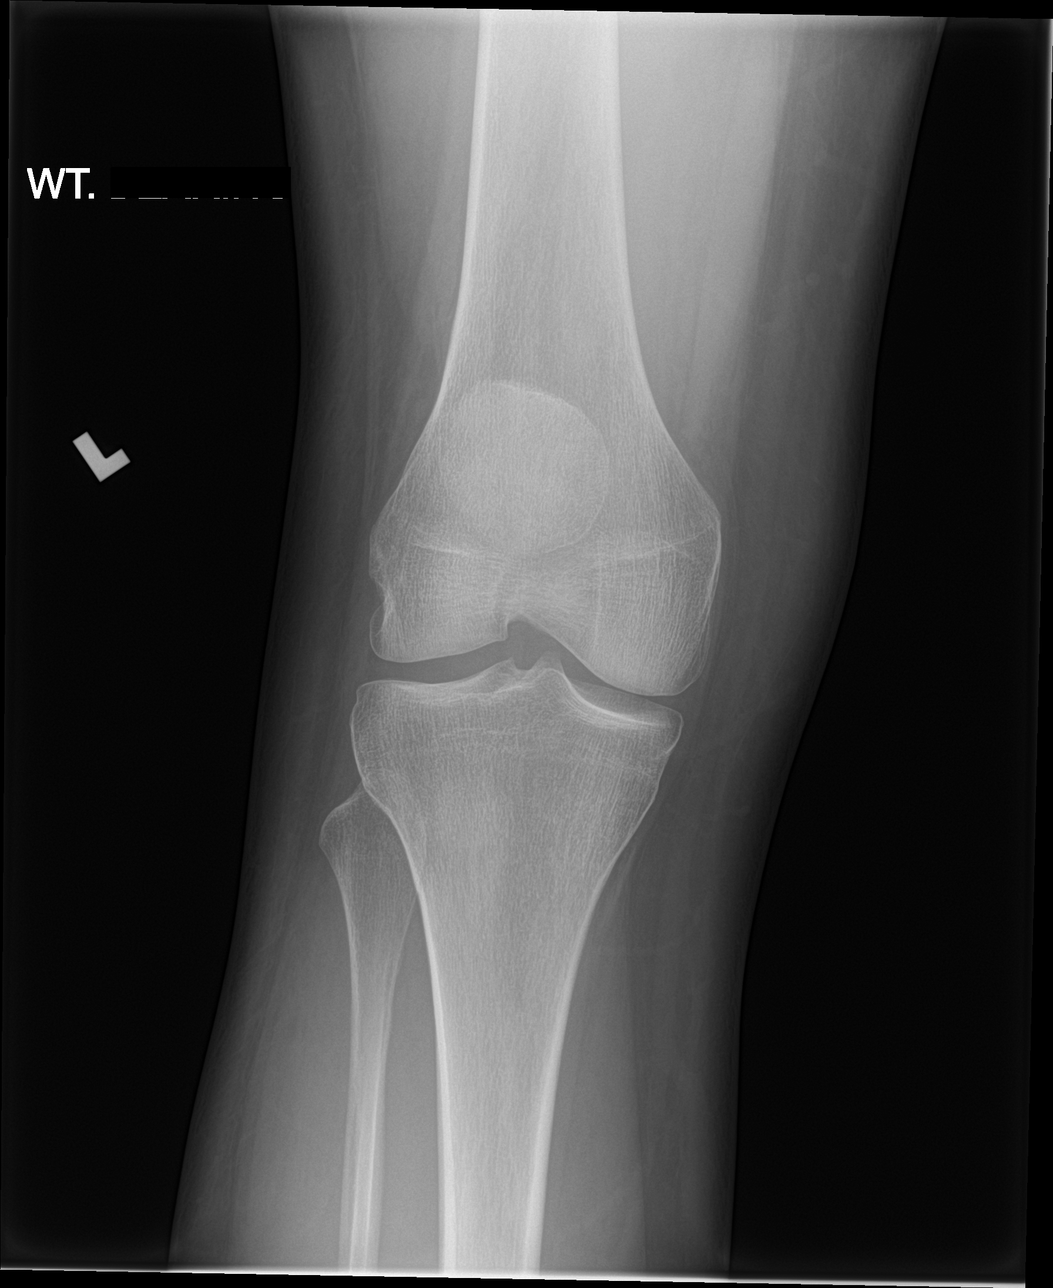

[knee lat]
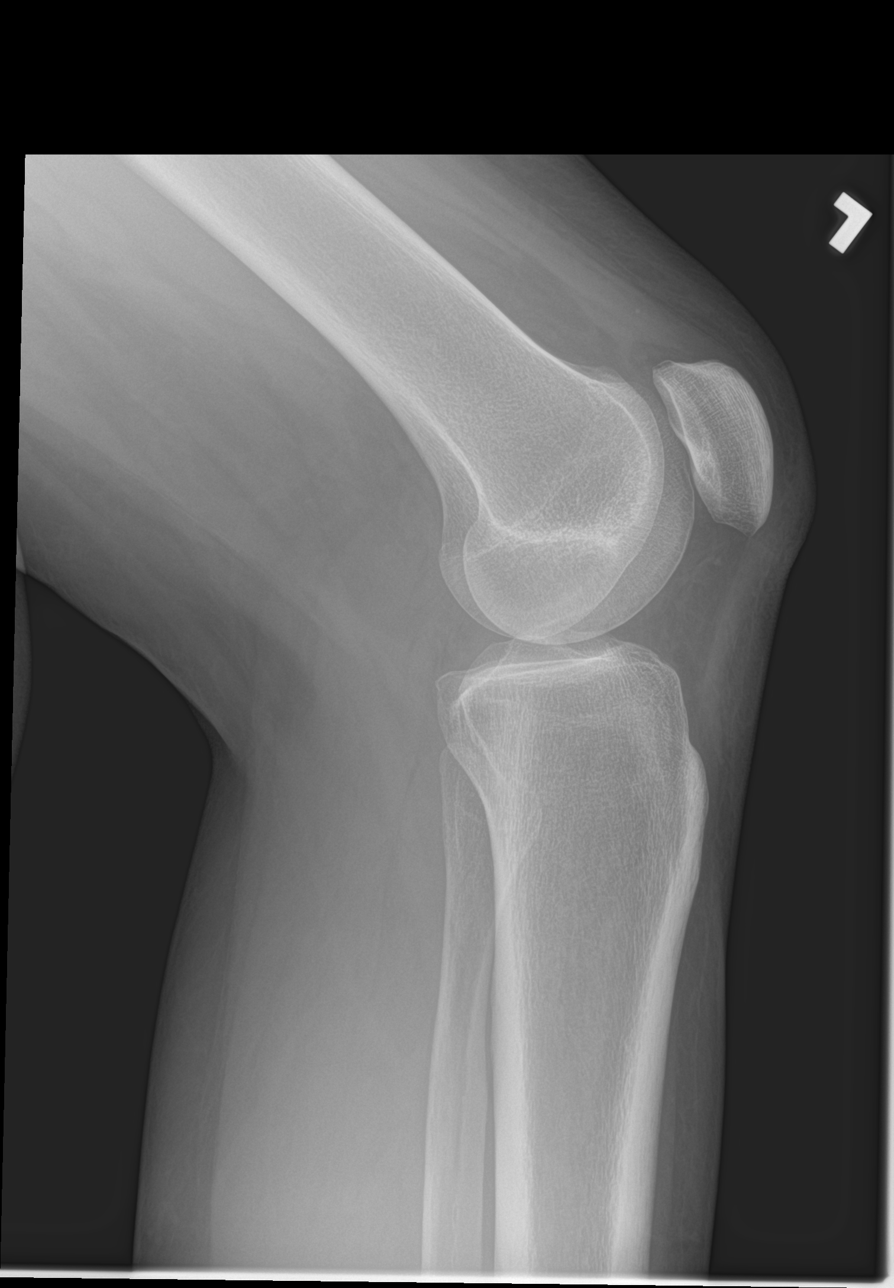

[knee sunrise]
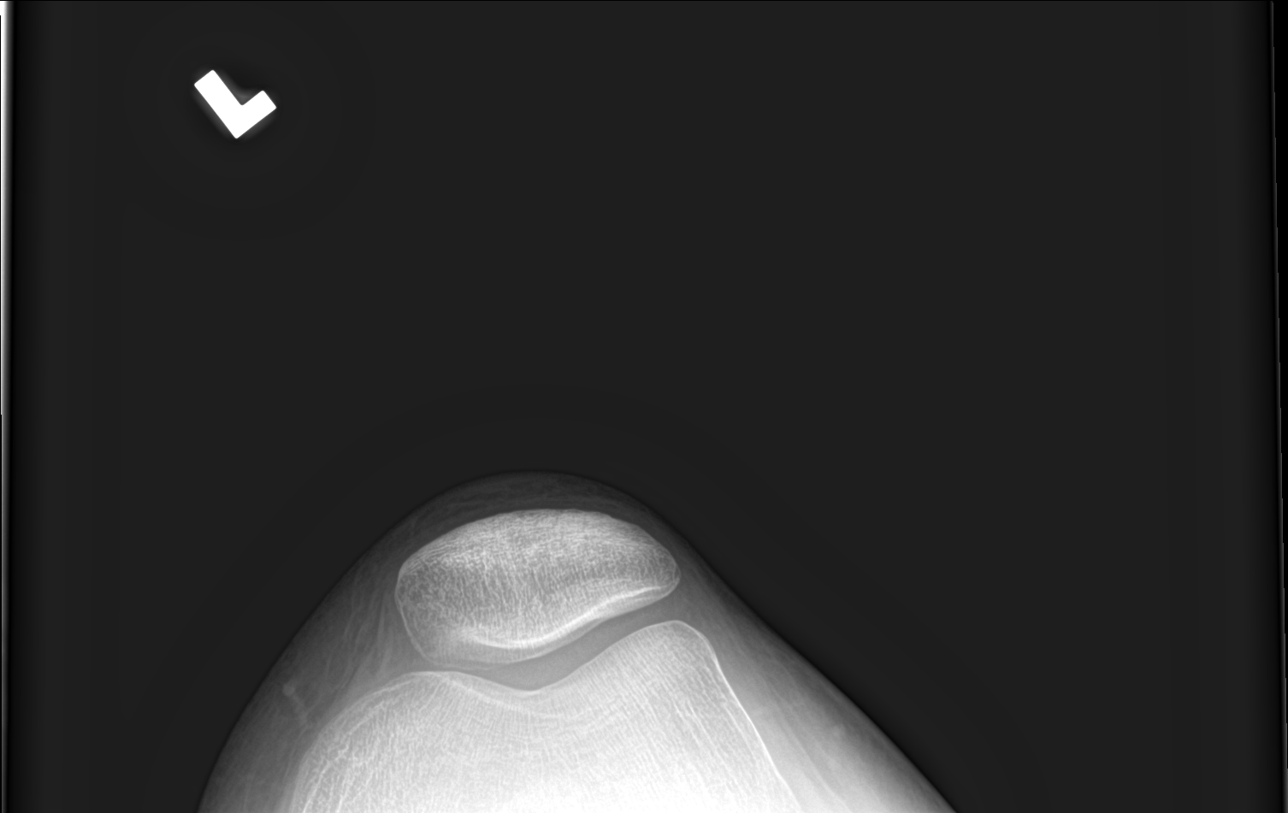

[4 of 4 positions shown; findings below may reference images not displayed]

FINDINGS: There is no evidence of fracture, dislocation, or joint effusion.
There is no evidence of arthropathy or other focal bone abnormality.
Soft tissues are unremarkable.
IMPRESSION: Negative.

## 2017-12-13 DIAGNOSIS — Z6824 Body mass index (BMI) 24.0-24.9, adult: Secondary | ICD-10-CM | POA: Diagnosis not present

## 2017-12-13 DIAGNOSIS — N803 Endometriosis of pelvic peritoneum: Secondary | ICD-10-CM | POA: Diagnosis not present

## 2017-12-13 DIAGNOSIS — Z0000 Encounter for general adult medical examination without abnormal findings: Secondary | ICD-10-CM | POA: Diagnosis not present

## 2017-12-13 DIAGNOSIS — Z131 Encounter for screening for diabetes mellitus: Secondary | ICD-10-CM | POA: Diagnosis not present

## 2017-12-13 DIAGNOSIS — Z1329 Encounter for screening for other suspected endocrine disorder: Secondary | ICD-10-CM | POA: Diagnosis not present

## 2017-12-13 DIAGNOSIS — Z23 Encounter for immunization: Secondary | ICD-10-CM | POA: Diagnosis not present

## 2017-12-13 DIAGNOSIS — Z13220 Encounter for screening for lipoid disorders: Secondary | ICD-10-CM | POA: Diagnosis not present

## 2017-12-13 DIAGNOSIS — Z01419 Encounter for gynecological examination (general) (routine) without abnormal findings: Secondary | ICD-10-CM | POA: Diagnosis not present

## 2017-12-13 DIAGNOSIS — Z130 Encounter for screening for diseases of the blood and blood-forming organs and certain disorders involving the immune mechanism: Secondary | ICD-10-CM | POA: Diagnosis not present

## 2018-03-13 DIAGNOSIS — Z3201 Encounter for pregnancy test, result positive: Secondary | ICD-10-CM | POA: Diagnosis not present

## 2018-03-13 DIAGNOSIS — Z789 Other specified health status: Secondary | ICD-10-CM | POA: Diagnosis not present

## 2018-03-19 DIAGNOSIS — Z3201 Encounter for pregnancy test, result positive: Secondary | ICD-10-CM | POA: Diagnosis not present

## 2018-04-07 DIAGNOSIS — Z3481 Encounter for supervision of other normal pregnancy, first trimester: Secondary | ICD-10-CM | POA: Diagnosis not present

## 2018-04-07 DIAGNOSIS — Z3689 Encounter for other specified antenatal screening: Secondary | ICD-10-CM | POA: Diagnosis not present

## 2018-04-07 LAB — OB RESULTS CONSOLE ABO/RH: RH TYPE: POSITIVE

## 2018-04-07 LAB — OB RESULTS CONSOLE ANTIBODY SCREEN: Antibody Screen: NEGATIVE

## 2018-04-07 LAB — OB RESULTS CONSOLE HIV ANTIBODY (ROUTINE TESTING): HIV: NONREACTIVE

## 2018-04-07 LAB — OB RESULTS CONSOLE HEPATITIS B SURFACE ANTIGEN: Hepatitis B Surface Ag: NEGATIVE

## 2018-04-07 LAB — OB RESULTS CONSOLE GC/CHLAMYDIA
CHLAMYDIA, DNA PROBE: NEGATIVE
Gonorrhea: NEGATIVE

## 2018-04-07 LAB — OB RESULTS CONSOLE RUBELLA ANTIBODY, IGM: Rubella: IMMUNE

## 2018-04-07 LAB — OB RESULTS CONSOLE RPR: RPR: NONREACTIVE

## 2018-04-14 DIAGNOSIS — Z118 Encounter for screening for other infectious and parasitic diseases: Secondary | ICD-10-CM | POA: Diagnosis not present

## 2018-04-14 DIAGNOSIS — O3680X0 Pregnancy with inconclusive fetal viability, not applicable or unspecified: Secondary | ICD-10-CM | POA: Diagnosis not present

## 2018-04-14 DIAGNOSIS — Z3A10 10 weeks gestation of pregnancy: Secondary | ICD-10-CM | POA: Diagnosis not present

## 2018-04-14 DIAGNOSIS — Z3689 Encounter for other specified antenatal screening: Secondary | ICD-10-CM | POA: Diagnosis not present

## 2018-04-14 DIAGNOSIS — Z113 Encounter for screening for infections with a predominantly sexual mode of transmission: Secondary | ICD-10-CM | POA: Diagnosis not present

## 2018-04-14 DIAGNOSIS — Z3481 Encounter for supervision of other normal pregnancy, first trimester: Secondary | ICD-10-CM | POA: Diagnosis not present

## 2018-04-14 DIAGNOSIS — Z124 Encounter for screening for malignant neoplasm of cervix: Secondary | ICD-10-CM | POA: Diagnosis not present

## 2018-04-21 DIAGNOSIS — Z3481 Encounter for supervision of other normal pregnancy, first trimester: Secondary | ICD-10-CM | POA: Diagnosis not present

## 2018-05-13 DIAGNOSIS — Z3482 Encounter for supervision of other normal pregnancy, second trimester: Secondary | ICD-10-CM | POA: Diagnosis not present

## 2018-05-30 DIAGNOSIS — O09292 Supervision of pregnancy with other poor reproductive or obstetric history, second trimester: Secondary | ICD-10-CM | POA: Diagnosis not present

## 2018-05-30 DIAGNOSIS — Z3A17 17 weeks gestation of pregnancy: Secondary | ICD-10-CM | POA: Diagnosis not present

## 2018-05-30 DIAGNOSIS — Z361 Encounter for antenatal screening for raised alphafetoprotein level: Secondary | ICD-10-CM | POA: Diagnosis not present

## 2018-06-11 DIAGNOSIS — Z3A18 18 weeks gestation of pregnancy: Secondary | ICD-10-CM | POA: Diagnosis not present

## 2018-06-11 DIAGNOSIS — Z363 Encounter for antenatal screening for malformations: Secondary | ICD-10-CM | POA: Diagnosis not present

## 2018-06-11 DIAGNOSIS — O09292 Supervision of pregnancy with other poor reproductive or obstetric history, second trimester: Secondary | ICD-10-CM | POA: Diagnosis not present

## 2018-06-25 DIAGNOSIS — Z3A20 20 weeks gestation of pregnancy: Secondary | ICD-10-CM | POA: Diagnosis not present

## 2018-06-25 DIAGNOSIS — Z362 Encounter for other antenatal screening follow-up: Secondary | ICD-10-CM | POA: Diagnosis not present

## 2018-06-25 DIAGNOSIS — O09292 Supervision of pregnancy with other poor reproductive or obstetric history, second trimester: Secondary | ICD-10-CM | POA: Diagnosis not present

## 2018-06-25 DIAGNOSIS — Z3686 Encounter for antenatal screening for cervical length: Secondary | ICD-10-CM | POA: Diagnosis not present

## 2018-07-23 DIAGNOSIS — Z3A24 24 weeks gestation of pregnancy: Secondary | ICD-10-CM | POA: Diagnosis not present

## 2018-07-23 DIAGNOSIS — O09292 Supervision of pregnancy with other poor reproductive or obstetric history, second trimester: Secondary | ICD-10-CM | POA: Diagnosis not present

## 2018-08-13 DIAGNOSIS — Z3A27 27 weeks gestation of pregnancy: Secondary | ICD-10-CM | POA: Diagnosis not present

## 2018-08-13 DIAGNOSIS — O09292 Supervision of pregnancy with other poor reproductive or obstetric history, second trimester: Secondary | ICD-10-CM | POA: Diagnosis not present

## 2018-08-13 DIAGNOSIS — Z3689 Encounter for other specified antenatal screening: Secondary | ICD-10-CM | POA: Diagnosis not present

## 2018-08-25 DIAGNOSIS — Z3689 Encounter for other specified antenatal screening: Secondary | ICD-10-CM | POA: Diagnosis not present

## 2018-08-25 DIAGNOSIS — Z23 Encounter for immunization: Secondary | ICD-10-CM | POA: Diagnosis not present

## 2018-08-25 DIAGNOSIS — O09292 Supervision of pregnancy with other poor reproductive or obstetric history, second trimester: Secondary | ICD-10-CM | POA: Diagnosis not present

## 2018-08-25 DIAGNOSIS — Z3A29 29 weeks gestation of pregnancy: Secondary | ICD-10-CM | POA: Diagnosis not present

## 2018-08-31 ENCOUNTER — Inpatient Hospital Stay (HOSPITAL_COMMUNITY)
Admission: EM | Admit: 2018-08-31 | Discharge: 2018-08-31 | Disposition: A | Discharge status: Home / Self Care | Payer: BLUE CROSS/BLUE SHIELD | Source: Ambulatory Visit | Attending: Obstetrics and Gynecology | Admitting: Obstetrics and Gynecology

## 2018-08-31 ENCOUNTER — Encounter (HOSPITAL_COMMUNITY): Payer: Self-pay | Admitting: *Deleted

## 2018-08-31 DIAGNOSIS — O9A213 Injury, poisoning and certain other consequences of external causes complicating pregnancy, third trimester: Secondary | ICD-10-CM | POA: Insufficient documentation

## 2018-08-31 DIAGNOSIS — Z3A30 30 weeks gestation of pregnancy: Secondary | ICD-10-CM | POA: Diagnosis not present

## 2018-08-31 DIAGNOSIS — W19XXXA Unspecified fall, initial encounter: Secondary | ICD-10-CM | POA: Diagnosis not present

## 2018-08-31 DIAGNOSIS — O26893 Other specified pregnancy related conditions, third trimester: Secondary | ICD-10-CM

## 2018-08-31 DIAGNOSIS — S80211A Abrasion, right knee, initial encounter: Secondary | ICD-10-CM | POA: Diagnosis not present

## 2018-08-31 DIAGNOSIS — O9A219 Injury, poisoning and certain other consequences of external causes complicating pregnancy, unspecified trimester: Secondary | ICD-10-CM

## 2018-08-31 DIAGNOSIS — O26899 Other specified pregnancy related conditions, unspecified trimester: Secondary | ICD-10-CM

## 2018-08-31 LAB — URINALYSIS, ROUTINE W REFLEX MICROSCOPIC
BILIRUBIN URINE: NEGATIVE
Glucose, UA: NEGATIVE mg/dL
HGB URINE DIPSTICK: NEGATIVE
Ketones, ur: NEGATIVE mg/dL
Leukocytes, UA: NEGATIVE
Nitrite: NEGATIVE
PROTEIN: NEGATIVE mg/dL
SPECIFIC GRAVITY, URINE: 1.008 (ref 1.005–1.030)
pH: 8 (ref 5.0–8.0)

## 2018-08-31 NOTE — MAU Note (Addendum)
Presents here after fall 40 minutes ago States was out for a walk.  States rolled left ankle and fell on right knee.  Denies hitting abdomen or back. Denies any pain at this time. Report that she feels fine. Came in per the on call recommendation. +FM  Surface abrasion noted on right knee. Size of fifty cent piece.   Vitals:   08/31/18 1216  BP: 123/72  Pulse: (!) 107  Resp: 18  Temp: 98 F (36.7 C)  SpO2: 100%

## 2018-08-31 NOTE — MAU Provider Note (Signed)
Chief Complaint:  Fall   First Provider Initiated Contact with Patient 08/31/18 1339      HPI: Maria Prince is a 32 y.o. G2P0101 at [redacted]w[redacted]d who presents to maternity admissions reporting she twisted her ankle walking on a hiking trail/path today and fell hitting her right knee on the paved ground.  She did not hit her abdomen or land on her side. She denies any known injury except an abrasion to her right knee.  She denies any pain. Her baby is moving well. She has not required any treatment.     HPI  Past Medical History: Past Medical History:  Diagnosis Date  . Diabetes mellitus without complication (HCC)    gestational  . 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 Postpartum care following cesarean delivery (10/17) 10/16/2016    Past obstetric history: OB History  Gravida Para Term Preterm AB Living  2 1   1   1   SAB TAB Ectopic Multiple Live Births        1 1    # Outcome Date GA Lbr Len/2nd Weight Sex Delivery Anes PTL Lv  2 Current           1A Preterm 10/16/16 [redacted]w[redacted]d  2380 g F CS-LTranv EPI  LIV  1B Preterm 10/16/16 [redacted]w[redacted]d  2290 g F CS-LTranv EPI  LIV    Past Surgical History: Past Surgical History:  Procedure Laterality Date  . CESAREAN SECTION MULTI-GESTATIONAL N/A 10/16/2016   Procedure: CESAREAN SECTION MULTI-GESTATIONAL;  Surgeon: Olivia Mackie, MD;  Location: [facility] BIRTHING SUITES;  Service: Obstetrics;  Laterality: N/A;    Family History: Family History  Problem Relation Age of Onset  . Hyperlipidemia Other     Social History: Social History   Tobacco Use  . Smoking status: Never Smoker  . Smokeless tobacco: Never Used  Substance Use Topics  . Alcohol use: Yes    Comment: occasionally throughout the month  . Drug use: No    Allergies:  Allergies  Allergen Reactions  . Neomycin   . Tetracyclines & Related     Pt states she is allergic to "tetracortisol"  . Desonide Rash  . Fluocinonide Rash  . Hydrocortisone Butyrate Rash  . Triamcinolone Rash     Meds:  No medications prior to admission.    ROS:  Review of Systems  Constitutional: Negative for chills, fatigue and fever.  Eyes: Negative for visual disturbance.  Respiratory: Negative for shortness of breath.   Cardiovascular: Negative for chest pain.  Gastrointestinal: Negative for abdominal pain, nausea and vomiting.  Genitourinary: Negative for difficulty urinating, dysuria, flank pain, pelvic pain, vaginal bleeding, vaginal discharge and vaginal pain.  Skin: Positive for wound.  Neurological: Negative for dizziness and headaches.  Psychiatric/Behavioral: Negative.      I have reviewed patient's Past Medical Hx, Surgical Hx, Family Hx, Social Hx, medications and allergies.   Physical Exam   Patient Vitals for the past 24 hrs:  BP Temp Temp src Pulse Resp SpO2 Weight  08/31/18 1453 104/67 - - 84 18 100 % -  08/31/18 1216 123/72 98 F (36.7 C) Oral (!) 107 18 100 % -  08/31/18 1155 - - - - - - 90.3 kg   Constitutional: Well-developed, well-nourished female in no acute distress.  Cardiovascular: normal rate Respiratory: normal effort GI: Abd soft, non-tender, gravid appropriate for gestational age.  MS: Extremities nontender, no edema, normal ROM Neurologic: Alert and oriented x 4.  GU: Neg CVAT.  FHT:  Baseline 135 , moderate variability, accelerations present, no decelerations Contractions: None on toco or to palpation   Labs: Results for orders placed or performed during the hospital encounter of 08/31/18 (from the past 24 hour(s))  Urinalysis, Routine w reflex microscopic     Status: Abnormal   Collection Time: 08/31/18 12:18 PM  Result Value Ref Range   Color, Urine STRAW (A) YELLOW   APPearance CLEAR CLEAR   Specific Gravity, Urine 1.008 1.005 - 1.030   pH 8.0 5.0 - 8.0   Glucose, UA NEGATIVE NEGATIVE mg/dL   Hgb urine dipstick NEGATIVE NEGATIVE   Bilirubin Urine NEGATIVE NEGATIVE   Ketones, ur NEGATIVE NEGATIVE mg/dL   Protein, ur NEGATIVE  NEGATIVE mg/dL   Nitrite NEGATIVE NEGATIVE   Leukocytes, UA NEGATIVE NEGATIVE      Imaging:  No results found.  MAU Course/MDM: I have ordered labs and reviewed results.  NST reviewed and reactive x 2 + hours in MAU Pt without cramping/contractions/vaginal bleeding/leakage of fluid, and no contractions on TOCO, so no evidence of preterm labor Consult Dr Amado Nash with presentation, exam findings and test results.  D/C home, f/u in office as scheduled Pt discharge with strict return precautions.   Assessment: 1. Injury affecting pregnancy   2. Fall, initial encounter     Plan: Discharge home Labor precautions and fetal kick counts  Follow-up Information    [facility], [facility] Follow up.   Why:  As scheduled, return to MAU as needed for emergencies Contact information: [address] [place] [address] [phone]          Allergies as of 08/31/2018      Reactions   Neomycin    Tetracyclines & Related    Pt states she is allergic to "tetracortisol"   Desonide Rash   Fluocinonide Rash   Hydrocortisone Butyrate Rash   Triamcinolone Rash      Medication List    STOP taking these medications   ibuprofen 600 MG tablet Commonly known as:  ADVIL,MOTRIN   NIFEdipine 30 MG 24 hr tablet Commonly known as:  PROCARDIA-XL/ADALAT CC   Oxycodone HCl 10 MG Tabs   simethicone 80 MG chewable tablet Commonly known as:  MYLICON     TAKE these medications   acetaminophen 325 MG tablet Commonly known as:  TYLENOL Take 2 tablets (650 mg total) by mouth every 6 (six) hours as needed for mild pain or moderate pain.   coconut oil Oil Apply 1 application topically as needed.   IRON PO Take 1 tablet by mouth daily.   magnesium oxide 400 (241.3 Mg) MG tablet Commonly known as:  MAG-OX Take 0.5 tablets (200 mg total) by mouth 2 (two) times daily.   prenatal multivitamin Tabs tablet Take 1 tablet by mouth daily at 12 noon.       Sharen Counter Certified  Nurse-Midwife 08/31/2018 3:50 PM

## 2018-09-09 DIAGNOSIS — O09292 Supervision of pregnancy with other poor reproductive or obstetric history, second trimester: Secondary | ICD-10-CM | POA: Diagnosis not present

## 2018-09-09 DIAGNOSIS — Z3A31 31 weeks gestation of pregnancy: Secondary | ICD-10-CM | POA: Diagnosis not present

## 2018-09-15 DIAGNOSIS — Z3482 Encounter for supervision of other normal pregnancy, second trimester: Secondary | ICD-10-CM | POA: Diagnosis not present

## 2018-09-15 DIAGNOSIS — Z3483 Encounter for supervision of other normal pregnancy, third trimester: Secondary | ICD-10-CM | POA: Diagnosis not present

## 2018-09-25 DIAGNOSIS — Z23 Encounter for immunization: Secondary | ICD-10-CM | POA: Diagnosis not present

## 2018-09-25 DIAGNOSIS — O09293 Supervision of pregnancy with other poor reproductive or obstetric history, third trimester: Secondary | ICD-10-CM | POA: Diagnosis not present

## 2018-09-25 DIAGNOSIS — Z3A34 34 weeks gestation of pregnancy: Secondary | ICD-10-CM | POA: Diagnosis not present

## 2018-10-02 DIAGNOSIS — Z3A35 35 weeks gestation of pregnancy: Secondary | ICD-10-CM | POA: Diagnosis not present

## 2018-10-02 DIAGNOSIS — Z3685 Encounter for antenatal screening for Streptococcus B: Secondary | ICD-10-CM | POA: Diagnosis not present

## 2018-10-02 DIAGNOSIS — O09293 Supervision of pregnancy with other poor reproductive or obstetric history, third trimester: Secondary | ICD-10-CM | POA: Diagnosis not present

## 2018-10-08 DIAGNOSIS — O09293 Supervision of pregnancy with other poor reproductive or obstetric history, third trimester: Secondary | ICD-10-CM | POA: Diagnosis not present

## 2018-10-08 DIAGNOSIS — Z3A35 35 weeks gestation of pregnancy: Secondary | ICD-10-CM | POA: Diagnosis not present

## 2018-10-15 DIAGNOSIS — Z3A36 36 weeks gestation of pregnancy: Secondary | ICD-10-CM | POA: Diagnosis not present

## 2018-10-15 DIAGNOSIS — O09293 Supervision of pregnancy with other poor reproductive or obstetric history, third trimester: Secondary | ICD-10-CM | POA: Diagnosis not present

## 2018-10-24 DIAGNOSIS — O09293 Supervision of pregnancy with other poor reproductive or obstetric history, third trimester: Secondary | ICD-10-CM | POA: Diagnosis not present

## 2018-10-24 DIAGNOSIS — Z3A38 38 weeks gestation of pregnancy: Secondary | ICD-10-CM | POA: Diagnosis not present

## 2018-10-29 DIAGNOSIS — O09293 Supervision of pregnancy with other poor reproductive or obstetric history, third trimester: Secondary | ICD-10-CM | POA: Diagnosis not present

## 2018-10-29 DIAGNOSIS — Z3A38 38 weeks gestation of pregnancy: Secondary | ICD-10-CM | POA: Diagnosis not present

## 2018-10-31 ENCOUNTER — Encounter (HOSPITAL_COMMUNITY): Payer: Self-pay

## 2018-10-31 ENCOUNTER — Other Ambulatory Visit: Payer: Self-pay | Admitting: Obstetrics and Gynecology

## 2018-10-31 NOTE — Patient Instructions (Signed)
Maria Prince  10/31/2018   Your procedure is scheduled on:  11/04/2018  Enter through the Main Entrance of [facility] at 1245 AM.  Pick up the phone at the desk and dial [phone]  Call this number if you have problems the morning of surgery:[phone]  Remember:   Do not eat food:(After Midnight) Desps de medianoche.  Do not drink clear liquids: (After Midnight) Desps de medianoche.  Take these medicines the morning of surgery with A SIP OF WATER: may take protonix   Do not wear jewelry, make-up or nail polish.  Do not wear lotions, powders, or perfumes. Do not wear deodorant.  Do not shave 48 hours prior to surgery.  Do not bring valuables to the hospital.  [facility] is not   responsible for any belongings or valuables brought to the hospital.  Contacts, dentures or bridgework may not be worn into surgery.  Leave suitcase in the car. After surgery it may be brought to your room.  For patients admitted to the hospital, checkout time is 11:00 AM the day of              discharge.    N/A   Please read over the following fact sheets that you were given:   Surgical Site Infection Prevention

## 2018-11-03 ENCOUNTER — Encounter (HOSPITAL_COMMUNITY)
Admission: RE | Admit: 2018-11-03 | Discharge: 2018-11-03 | Disposition: A | Discharge status: Home / Self Care | Payer: BLUE CROSS/BLUE SHIELD | Source: Ambulatory Visit | Attending: Obstetrics and Gynecology | Admitting: Obstetrics and Gynecology

## 2018-11-03 DIAGNOSIS — O24429 Gestational diabetes mellitus in childbirth, unspecified control: Secondary | ICD-10-CM | POA: Diagnosis not present

## 2018-11-03 DIAGNOSIS — O3663X0 Maternal care for excessive fetal growth, third trimester, not applicable or unspecified: Secondary | ICD-10-CM | POA: Diagnosis not present

## 2018-11-03 DIAGNOSIS — O9962 Diseases of the digestive system complicating childbirth: Secondary | ICD-10-CM | POA: Diagnosis not present

## 2018-11-03 DIAGNOSIS — Z3A40 40 weeks gestation of pregnancy: Secondary | ICD-10-CM | POA: Diagnosis not present

## 2018-11-03 DIAGNOSIS — K219 Gastro-esophageal reflux disease without esophagitis: Secondary | ICD-10-CM | POA: Diagnosis not present

## 2018-11-03 DIAGNOSIS — O34211 Maternal care for low transverse scar from previous cesarean delivery: Secondary | ICD-10-CM | POA: Diagnosis not present

## 2018-11-03 DIAGNOSIS — O9081 Anemia of the puerperium: Secondary | ICD-10-CM | POA: Diagnosis not present

## 2018-11-03 DIAGNOSIS — Z23 Encounter for immunization: Secondary | ICD-10-CM | POA: Diagnosis not present

## 2018-11-03 DIAGNOSIS — D62 Acute posthemorrhagic anemia: Secondary | ICD-10-CM | POA: Diagnosis not present

## 2018-11-03 DIAGNOSIS — Z3A39 39 weeks gestation of pregnancy: Secondary | ICD-10-CM | POA: Diagnosis not present

## 2018-11-03 DIAGNOSIS — Z3A00 Weeks of gestation of pregnancy not specified: Secondary | ICD-10-CM | POA: Diagnosis not present

## 2018-11-03 HISTORY — DX: Nausea with vomiting, unspecified: R11.2

## 2018-11-03 HISTORY — DX: Other specified postprocedural states: Z98.890

## 2018-11-03 HISTORY — DX: Unspecified abnormal cytological findings in specimens from vagina: R87.629

## 2018-11-03 HISTORY — DX: Gastro-esophageal reflux disease without esophagitis: K21.9

## 2018-11-03 HISTORY — DX: Personal history of other diseases of the female genital tract: Z87.42

## 2018-11-03 LAB — TYPE AND SCREEN
ABO/RH(D): O POS
Antibody Screen: NEGATIVE

## 2018-11-03 LAB — CBC
HEMATOCRIT: 32.4 % — AB (ref 36.0–46.0)
Hemoglobin: 10.1 g/dL — ABNORMAL LOW (ref 12.0–15.0)
MCH: 24.8 pg — AB (ref 26.0–34.0)
MCHC: 31.2 g/dL (ref 30.0–36.0)
MCV: 79.4 fL — AB (ref 80.0–100.0)
Platelets: 240 10*3/uL (ref 150–400)
RBC: 4.08 MIL/uL (ref 3.87–5.11)
RDW: 14.1 % (ref 11.5–15.5)
WBC: 7.4 10*3/uL (ref 4.0–10.5)

## 2018-11-04 ENCOUNTER — Encounter (HOSPITAL_COMMUNITY): Payer: Self-pay | Admitting: *Deleted

## 2018-11-04 ENCOUNTER — Encounter (HOSPITAL_COMMUNITY)
Admission: AD | Disposition: A | Discharge status: Home / Self Care | Payer: Self-pay | Source: Home / Self Care | Attending: Obstetrics and Gynecology

## 2018-11-04 ENCOUNTER — Other Ambulatory Visit: Payer: Self-pay

## 2018-11-04 ENCOUNTER — Inpatient Hospital Stay (HOSPITAL_COMMUNITY): Payer: BLUE CROSS/BLUE SHIELD | Admitting: Certified Registered Nurse Anesthetist

## 2018-11-04 ENCOUNTER — Inpatient Hospital Stay (HOSPITAL_COMMUNITY)
Admission: AD | Admit: 2018-11-04 | Discharge: 2018-11-06 | DRG: 787 | Disposition: A | Discharge status: Home / Self Care | Payer: BLUE CROSS/BLUE SHIELD | Attending: Obstetrics and Gynecology | Admitting: Obstetrics and Gynecology

## 2018-11-04 DIAGNOSIS — O9962 Diseases of the digestive system complicating childbirth: Secondary | ICD-10-CM | POA: Diagnosis present

## 2018-11-04 DIAGNOSIS — O9081 Anemia of the puerperium: Secondary | ICD-10-CM | POA: Diagnosis not present

## 2018-11-04 DIAGNOSIS — Z3A40 40 weeks gestation of pregnancy: Secondary | ICD-10-CM | POA: Diagnosis not present

## 2018-11-04 DIAGNOSIS — K219 Gastro-esophageal reflux disease without esophagitis: Secondary | ICD-10-CM | POA: Diagnosis present

## 2018-11-04 DIAGNOSIS — O3663X0 Maternal care for excessive fetal growth, third trimester, not applicable or unspecified: Secondary | ICD-10-CM | POA: Diagnosis present

## 2018-11-04 DIAGNOSIS — D62 Acute posthemorrhagic anemia: Secondary | ICD-10-CM | POA: Diagnosis not present

## 2018-11-04 DIAGNOSIS — O34219 Maternal care for unspecified type scar from previous cesarean delivery: Secondary | ICD-10-CM | POA: Diagnosis present

## 2018-11-04 DIAGNOSIS — O24429 Gestational diabetes mellitus in childbirth, unspecified control: Secondary | ICD-10-CM | POA: Diagnosis not present

## 2018-11-04 DIAGNOSIS — Z3A00 Weeks of gestation of pregnancy not specified: Secondary | ICD-10-CM | POA: Diagnosis not present

## 2018-11-04 DIAGNOSIS — Z98891 History of uterine scar from previous surgery: Secondary | ICD-10-CM

## 2018-11-04 DIAGNOSIS — O34211 Maternal care for low transverse scar from previous cesarean delivery: Secondary | ICD-10-CM | POA: Diagnosis not present

## 2018-11-04 DIAGNOSIS — Z3A39 39 weeks gestation of pregnancy: Secondary | ICD-10-CM | POA: Diagnosis not present

## 2018-11-04 LAB — RPR: RPR: NONREACTIVE

## 2018-11-04 SURGERY — Surgical Case
Anesthesia: Spinal | Site: Abdomen | Wound class: Clean Contaminated

## 2018-11-04 MED ORDER — WITCH HAZEL-GLYCERIN EX PADS: 1.0000 "application " | MEDICATED_PAD | CUTANEOUS | Status: DC | PRN

## 2018-11-04 MED ORDER — OXYTOCIN 10 UNIT/ML IJ SOLN
INTRAMUSCULAR | Status: AC
  Filled 2018-11-04: qty 4

## 2018-11-04 MED ORDER — PHENYLEPHRINE 8 MG IN D5W 100 ML (0.08MG/ML) PREMIX OPTIME
INJECTION | INTRAVENOUS | Status: AC
  Filled 2018-11-04: qty 100

## 2018-11-04 MED ORDER — BUPIVACAINE HCL (PF) 0.25 % IJ SOLN
INTRAMUSCULAR | Status: AC
  Filled 2018-11-04: qty 10

## 2018-11-04 MED ORDER — IBUPROFEN 600 MG PO TABS
600.0000 mg | ORAL_TABLET | Freq: Four times a day (QID) | ORAL | Status: DC
  Administered 2018-11-04 – 2018-11-06 (×8): 600 mg via ORAL
  Filled 2018-11-04 (×8): qty 1

## 2018-11-04 MED ORDER — OXYTOCIN 40 UNITS IN LACTATED RINGERS INFUSION - SIMPLE MED: 2.5000 [IU]/h | INTRAVENOUS | Status: DC

## 2018-11-04 MED ORDER — FENTANYL CITRATE (PF) 100 MCG/2ML IJ SOLN
INTRAMUSCULAR | Status: DC | PRN
  Administered 2018-11-04: 15 ug via INTRAVENOUS

## 2018-11-04 MED ORDER — DIBUCAINE 1 % RE OINT: 1.0000 "application " | TOPICAL_OINTMENT | RECTAL | Status: DC | PRN

## 2018-11-04 MED ORDER — COCONUT OIL OIL: 1.0000 "application " | TOPICAL_OIL | Status: DC | PRN

## 2018-11-04 MED ORDER — PHENYLEPHRINE 40 MCG/ML (10ML) SYRINGE FOR IV PUSH (FOR BLOOD PRESSURE SUPPORT)
PREFILLED_SYRINGE | INTRAVENOUS | Status: AC
  Filled 2018-11-04: qty 10

## 2018-11-04 MED ORDER — MORPHINE SULFATE (PF) 0.5 MG/ML IJ SOLN
INTRAMUSCULAR | Status: AC
  Filled 2018-11-04: qty 10

## 2018-11-04 MED ORDER — DIPHENHYDRAMINE HCL 25 MG PO CAPS: 25.0000 mg | ORAL_CAPSULE | Freq: Four times a day (QID) | ORAL | Status: DC | PRN

## 2018-11-04 MED ORDER — SODIUM CHLORIDE 0.9 % IR SOLN
Status: DC | PRN
  Administered 2018-11-04: 1

## 2018-11-04 MED ORDER — PRENATAL MULTIVITAMIN CH
1.0000 | ORAL_TABLET | Freq: Every day | ORAL | Status: DC
  Administered 2018-11-05: 1 via ORAL
  Filled 2018-11-04 (×2): qty 1

## 2018-11-04 MED ORDER — MENTHOL 3 MG MT LOZG: 1.0000 | LOZENGE | OROMUCOSAL | Status: DC | PRN

## 2018-11-04 MED ORDER — DEXAMETHASONE SODIUM PHOSPHATE 4 MG/ML IJ SOLN
INTRAMUSCULAR | Status: AC
  Filled 2018-11-04: qty 1

## 2018-11-04 MED ORDER — ZOLPIDEM TARTRATE 5 MG PO TABS: 5.0000 mg | ORAL_TABLET | Freq: Every evening | ORAL | Status: DC | PRN

## 2018-11-04 MED ORDER — OXYCODONE HCL 5 MG PO TABS: 5.0000 mg | ORAL_TABLET | ORAL | Status: DC | PRN

## 2018-11-04 MED ORDER — LACTATED RINGERS IV SOLN
INTRAVENOUS | Status: DC
  Administered 2018-11-04: 22:00:00 via INTRAVENOUS

## 2018-11-04 MED ORDER — SIMETHICONE 80 MG PO CHEW: 80.0000 mg | CHEWABLE_TABLET | ORAL | Status: DC | PRN

## 2018-11-04 MED ORDER — SIMETHICONE 80 MG PO CHEW
80.0000 mg | CHEWABLE_TABLET | Freq: Three times a day (TID) | ORAL | Status: DC
  Administered 2018-11-04 – 2018-11-06 (×5): 80 mg via ORAL
  Filled 2018-11-04 (×6): qty 1

## 2018-11-04 MED ORDER — ONDANSETRON HCL 4 MG/2ML IJ SOLN
INTRAMUSCULAR | Status: AC
  Filled 2018-11-04: qty 2

## 2018-11-04 MED ORDER — OXYTOCIN 10 UNIT/ML IJ SOLN
INTRAVENOUS | Status: DC | PRN
  Administered 2018-11-04: 40 [IU] via INTRAVENOUS

## 2018-11-04 MED ORDER — PHENYLEPHRINE HCL 10 MG/ML IJ SOLN
INTRAMUSCULAR | Status: DC | PRN
  Administered 2018-11-04 (×2): 80 ug via INTRAVENOUS
  Administered 2018-11-04: 40 ug via INTRAVENOUS

## 2018-11-04 MED ORDER — OXYCODONE HCL 5 MG PO TABS: 10.0000 mg | ORAL_TABLET | ORAL | Status: DC | PRN

## 2018-11-04 MED ORDER — ONDANSETRON HCL 4 MG/2ML IJ SOLN
INTRAMUSCULAR | Status: DC | PRN
  Administered 2018-11-04: 4 mg via INTRAVENOUS

## 2018-11-04 MED ORDER — SIMETHICONE 80 MG PO CHEW
80.0000 mg | CHEWABLE_TABLET | ORAL | Status: DC
  Administered 2018-11-05 – 2018-11-06 (×2): 80 mg via ORAL
  Filled 2018-11-04 (×2): qty 1

## 2018-11-04 MED ORDER — SENNOSIDES-DOCUSATE SODIUM 8.6-50 MG PO TABS
2.0000 | ORAL_TABLET | ORAL | Status: DC
  Administered 2018-11-05 – 2018-11-06 (×2): 2 via ORAL
  Filled 2018-11-04 (×3): qty 2

## 2018-11-04 MED ORDER — LACTATED RINGERS IV SOLN
INTRAVENOUS | Status: DC
  Administered 2018-11-04 (×2): via INTRAVENOUS

## 2018-11-04 MED ORDER — PHENYLEPHRINE 8 MG IN D5W 100 ML (0.08MG/ML) PREMIX OPTIME
INJECTION | INTRAVENOUS | Status: DC | PRN
  Administered 2018-11-04: 60 ug/min via INTRAVENOUS

## 2018-11-04 MED ORDER — BUPIVACAINE HCL (PF) 0.25 % IJ SOLN
INTRAMUSCULAR | Status: DC | PRN
  Administered 2018-11-04: 10 mL

## 2018-11-04 MED ORDER — CEFAZOLIN SODIUM-DEXTROSE 2-4 GM/100ML-% IV SOLN
2.0000 g | INTRAVENOUS | Status: AC
  Administered 2018-11-04: 2 g via INTRAVENOUS

## 2018-11-04 MED ORDER — MORPHINE SULFATE (PF) 0.5 MG/ML IJ SOLN
INTRAMUSCULAR | Status: DC | PRN
  Administered 2018-11-04: .15 mg via EPIDURAL

## 2018-11-04 MED ORDER — FENTANYL CITRATE (PF) 100 MCG/2ML IJ SOLN
INTRAMUSCULAR | Status: AC
  Filled 2018-11-04: qty 2

## 2018-11-04 MED ORDER — LACTATED RINGERS IV SOLN
INTRAVENOUS | Status: DC | PRN
  Administered 2018-11-04: 15:00:00 via INTRAVENOUS

## 2018-11-04 SURGICAL SUPPLY — 46 items
APL SKNCLS STERI-STRIP NONHPOA (GAUZE/BANDAGES/DRESSINGS) ×1
BARRIER ADHS 3X4 INTERCEED (GAUZE/BANDAGES/DRESSINGS) ×2 IMPLANT
BENZOIN TINCTURE PRP APPL 2/3 (GAUZE/BANDAGES/DRESSINGS) ×1 IMPLANT
BRR ADH 4X3 ABS CNTRL BYND (GAUZE/BANDAGES/DRESSINGS) ×1
CHLORAPREP W/TINT 26ML (MISCELLANEOUS) ×2 IMPLANT
CLAMP CORD UMBIL (MISCELLANEOUS) IMPLANT
CLOSURE STERI STRIP 1/2 X4 (GAUZE/BANDAGES/DRESSINGS) ×1 IMPLANT
CLOTH BEACON ORANGE TIMEOUT ST (SAFETY) ×2 IMPLANT
DRAPE C SECTION CLR SCREEN (DRAPES) ×2 IMPLANT
DRSG OPSITE POSTOP 4X10 (GAUZE/BANDAGES/DRESSINGS) ×2 IMPLANT
ELECT REM PT RETURN 9FT ADLT (ELECTROSURGICAL) ×2
ELECTRODE REM PT RTRN 9FT ADLT (ELECTROSURGICAL) ×1 IMPLANT
EXTRACTOR VACUUM M CUP 4 TUBE (SUCTIONS) IMPLANT
GLOVE BIOGEL PI IND STRL 7.0 (GLOVE) ×2 IMPLANT
GLOVE BIOGEL PI INDICATOR 7.0 (GLOVE) ×2
GLOVE ECLIPSE 6.5 STRL STRAW (GLOVE) ×2 IMPLANT
GOWN STRL REUS W/TWL LRG LVL3 (GOWN DISPOSABLE) ×4 IMPLANT
KIT ABG SYR 3ML LUER SLIP (SYRINGE) IMPLANT
NDL HYPO 25X5/8 SAFETYGLIDE (NEEDLE) IMPLANT
NEEDLE HYPO 22GX1.5 SAFETY (NEEDLE) ×2 IMPLANT
NEEDLE HYPO 25X5/8 SAFETYGLIDE (NEEDLE) IMPLANT
NS IRRIG 1000ML POUR BTL (IV SOLUTION) ×2 IMPLANT
PACK C SECTION WH (CUSTOM PROCEDURE TRAY) ×2 IMPLANT
PAD OB MATERNITY 4.3X12.25 (PERSONAL CARE ITEMS) ×2 IMPLANT
RTRCTR C-SECT PINK 25CM LRG (MISCELLANEOUS) IMPLANT
SPONGE LAP 18X18 RF (DISPOSABLE) ×6 IMPLANT
STRIP CLOSURE SKIN 1/2X4 (GAUZE/BANDAGES/DRESSINGS) IMPLANT
SUT CHROMIC GUT AB #0 18 (SUTURE) IMPLANT
SUT MNCRL 0 VIOLET CTX 36 (SUTURE) ×3 IMPLANT
SUT MON AB 2-0 SH 27 (SUTURE)
SUT MON AB 2-0 SH27 (SUTURE) IMPLANT
SUT MON AB 3-0 SH 27 (SUTURE)
SUT MON AB 3-0 SH27 (SUTURE) IMPLANT
SUT MON AB 4-0 PS1 27 (SUTURE) IMPLANT
SUT MONOCRYL 0 CTX 36 (SUTURE) ×3
SUT PLAIN 2 0 (SUTURE)
SUT PLAIN 2 0 XLH (SUTURE) ×1 IMPLANT
SUT PLAIN ABS 2-0 CT1 27XMFL (SUTURE) IMPLANT
SUT VIC AB 0 CT1 36 (SUTURE) ×5 IMPLANT
SUT VIC AB 2-0 CT1 27 (SUTURE) ×2
SUT VIC AB 2-0 CT1 TAPERPNT 27 (SUTURE) ×1 IMPLANT
SUT VIC AB 4-0 KS 27 (SUTURE) ×1 IMPLANT
SUT VIC AB 4-0 PS2 27 (SUTURE) IMPLANT
SYR CONTROL 10ML LL (SYRINGE) ×2 IMPLANT
TOWEL OR 17X24 6PK STRL BLUE (TOWEL DISPOSABLE) ×2 IMPLANT
TRAY FOLEY W/BAG SLVR 14FR LF (SET/KITS/TRAYS/PACK) IMPLANT

## 2018-11-04 NOTE — Brief Op Note (Signed)
11/04/2018  2:55 PM  PATIENT:  Maria Prince  32 y.o. female  PRE-OPERATIVE DIAGNOSIS:  Previous Cesarean Section, fetal macrosomia, term gestation  POST-OPERATIVE DIAGNOSIS:  Previous Cesarean section, fetal macrosomia, term gestation  PROCEDURE:  Repeat Cesarean section, kerr hysterotomy  SURGEON:  Surgeon(s) and Role:    * Maxie Better, MD - Primary  PHYSICIAN ASSISTANT:   ASSISTANTS: Carlean Jews, CNM   ANESTHESIA:   spinal FINDINGS: live female ROT CAN x 1 reducible, nl tubes and ovaries 9 lb 11 oz EBL:  243 mL   BLOOD ADMINISTERED:none  DRAINS: none   LOCAL MEDICATIONS USED:  MARCAINE     SPECIMEN:  No Specimen  DISPOSITION OF SPECIMEN:  N/A  COUNTS:  YES  TOURNIQUET:  * No tourniquets in log *  DICTATION: .Other Dictation: Dictation Number [id number]  PLAN OF CARE: Admit to inpatient   PATIENT DISPOSITION:  PACU - hemodynamically stable.   Delay start of Pharmacological VTE agent (>24hrs) due to surgical blood loss or risk of bleeding: no

## 2018-11-04 NOTE — Op Note (Signed)
NAME: Maria Prince, Maria Prince MEDICAL RECORD [id number] ACCOUNT [id number] DATE OF BIRTH:[birth year] FACILITY: [facility] LOCATION: [id number] PHYSICIAN:Sotero Brinkmeyer A. Tangi Shroff, MD  OPERATIVE REPORT  DATE OF PROCEDURE:  11/04/2018  PREOPERATIVE DIAGNOSIS:  Previous cesarean section, suspected fetal macrosomia, term gestation.  PROCEDURE:  Repeat cesarean section, Kerr hysterotomy.  POSTOPERATIVE DIAGNOSIS:  Previous cesarean section, suspected fetal macrosomia, term gestation.  ANESTHESIA:  Spinal.  SURGEON:  Maxie Better, MD  ASSISTANT:  Carlean Jews, CNM  DESCRIPTION OF PROCEDURE:  Under adequate spinal anesthesia, the patient was placed in the supine position with a left lateral tilt.  She was sterilely prepped and draped in the usual fashion.  An indwelling Foley catheter was sterilely placed.  Marcaine 0.25% was injected along the previous Pfannenstiel skin incision.  Pfannenstiel skin incision was then made, carried down to the rectus fascia.  Rectus fascia was opened transversely.  The rectus fascia was then bluntly and sharply dissected off the  rectus muscle in a superior and inferior fashion.  The rectus muscle was split in the midline.  The parietal peritoneum was opened bluntly and extended superiorly.  This allowed for further extension of the rectus fascia off the rectus muscle superiorly. Once this was done, a self-retaining Alexis retractor was then placed.  Vesicouterine peritoneum was opened transversely.  The bladder was gently dissected off the lower uterine segment and displaced inferiorly using additional bladder retractor.  A curvilinear low transverse incision was then made and extended with bandage scissors.  Copious clear amniotic fluid was noted.  Subsequently, a live female with a nuchal cord from a right occiput transverse position.  Initial attempt at delivery was  unsuccessful, and therefore a vacuum was applied with subsequent delivery of the head.  The baby was bulb  suctioned on the abdomen, cord was reduced and the remaining portion of the baby was also delivered.  Delayed clamped cord for a minute was done.   The cord was then clamped cut.  The baby was transferred to awaiting pediatricians who assigned Apgars of 8 and 9 at one and five minutes.  Placenta was spontaneous and intact, not sent to pathology.  Uterine cavity was cleaned of debris.  The uterine incision had no extension.  It was closed in 2 layers, the first layer 0 Monocryl running lock stitch, second layer imbricating using 0 Monocryl suture.  Normal tubes and ovaries were noted laterally.  The abdomen was irrigated and suctioned of debris.   Interceed was placed in the lower uterine segment.  The Alexis retractor was then removed.  The parietal peritoneum was closed with 2-0 Vicryl.  The rectus fascia was closed with 0 Vicryl x2.  The subcutaneous area was irrigated, small bleeders cauterized.  Interrupted 2-0 plain sutures placed and the skin approximated using 4-0 Vicryl subcuticular closure.  Steri-Strips and benzoin were placed.  SPECIMEN:  Placenta not sent to pathology.  ESTIMATED BLOOD LOSS:  150 mL.  URINE OUTPUT:  100 mL.  INTRAOPERATIVE FLUIDS:  2400 mL LR.  COUNTS:  Sponge and instrument count x2 was correct.  COMPLICATIONS:  None.  The patient tolerated the procedure well and was transferred to recovery in stable condition.  LN/NUANCE  D:11/04/2018 T:11/04/2018 JOB:[id number]

## 2018-11-04 NOTE — H&P (Addendum)
Maria Prince is a 32 y.o. female presenting for rpt C/S @ 39 5/7 weeks. EFW 9lb 10 oz. Previous hx PPH. OB History    Gravida  2   Para  1   Term      Preterm  1   AB      Living  2     SAB      TAB      Ectopic      Multiple  1   Live Births  2          Past Medical History:  Diagnosis Date  . Diabetes mellitus without complication (HCC)    gestational  . GERD (gastroesophageal reflux disease)   . 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 Hx of endometriosis   . PONV (postoperative nausea and vomiting)   . Postpartum care following cesarean delivery (10/17) 10/16/2016  . Vaginal Pap smear, abnormal    Past Surgical History:  Procedure Laterality Date  . CESAREAN SECTION    . CESAREAN SECTION MULTI-GESTATIONAL N/A 10/16/2016   Procedure: CESAREAN SECTION MULTI-GESTATIONAL;  Surgeon: Olivia Mackie, MD;  Location: [facility] BIRTHING SUITES;  Service: Obstetrics;  Laterality: N/A;  . COLPOSCOPY    . WISDOM TOOTH EXTRACTION     Family History: family history includes Diabetes in her paternal grandfather; Heart disease in her paternal grandmother; Hyperlipidemia in her paternal grandfather and paternal grandmother; Hypertension in her father; Prostate cancer in her maternal grandfather; Skin cancer in her paternal grandfather and paternal grandmother. Social History:  reports that she has never smoked. She has never used smokeless tobacco. She reports that she drinks alcohol. She reports that she does not use drugs.     Maternal Diabetes: No Genetic Screening: Normal Maternal Ultrasounds/Referrals: Normal Fetal Ultrasounds or other Referrals:  None Maternal Substance Abuse:  No Significant Maternal Medications:  None Significant Maternal Lab Results:  Lab values include: Group B Strep negative Other Comments:  None  Review of Systems  All other systems reviewed and are negative.  History   unknown if currently breastfeeding. Exam Physical Exam  Constitutional:  She is oriented to person, place, and time. She appears well-developed and well-nourished.  Eyes: EOM are normal.  Neck: Neck supple.  Cardiovascular: Normal rate.  Respiratory: Effort normal.  GI:  pfannenstiel skin incision  Musculoskeletal: She exhibits no edema.  Neurological: She is alert and oriented to person, place, and time.  Skin: Skin is warm and dry.  Psychiatric: She has a normal mood and affect.    Prenatal labs: ABO, Rh: --/--/O POS (11/04 1003) Antibody: NEG (11/04 1003) Rubella: Immune (04/08 0000) RPR: Non Reactive (11/04 1000)  HBsAg: Negative (04/08 0000)  HIV: Non-reactive (04/08 0000)  GBS:   negative  Assessment/Plan: Previous C/S Suspect fetal macrosomia Term gestation P) repeat C/S. Risk of surgery reviewed including infection, bleeding, injury to bladder, bowel, ureter, internal scar tissue, poss need for blood transfusion and its risk( hiv, acute rxn, fever) all ? naswered   Maria Prince A Fedrick Cefalu 11/04/2018, 5:06 AM  Addendum:  I have re-examined pt  No change noted

## 2018-11-04 NOTE — Transfer of Care (Signed)
Immediate Anesthesia Transfer of Care Note  Patient: Maria Prince  Procedure(s) Performed: Repeat CESAREAN SECTION (N/A Abdomen)  Patient Location: PACU  Anesthesia Type:Spinal  Level of Consciousness: awake, oriented and patient cooperative  Airway & Oxygen Therapy: Patient Spontanous Breathing  Post-op Assessment: Report given to RN and Post -op Vital signs reviewed and stable  Post vital signs: Reviewed and stable  Last Vitals:  Vitals Value Taken Time  BP 117/61 11/04/2018  4:15 PM  Temp 36.4 C 11/04/2018  4:09 PM  Pulse 86 11/04/2018  4:15 PM  Resp 17 11/04/2018  4:15 PM  SpO2 98 % 11/04/2018  4:15 PM  Vitals shown include unvalidated device data.  Last Pain:  Vitals:   11/04/18 1609  TempSrc: Oral  PainSc: 0-No pain      Patients Stated Pain Goal: 6 (11/04/18 1307)  Complications: No apparent anesthesia complications

## 2018-11-04 NOTE — Anesthesia Preprocedure Evaluation (Addendum)
Anesthesia Evaluation  Patient identified by MRN, date of birth, ID band Patient awake    Reviewed: Allergy & Precautions, NPO status , Patient's Chart, lab work & pertinent test results  History of Anesthesia Complications (+) PONV and history of anesthetic complications  Airway Mallampati: I  TM Distance: >3 FB Neck ROM: Full    Dental no notable dental hx. (+) Teeth Intact, Dental Advisory Given   Pulmonary neg pulmonary ROS,    Pulmonary exam normal breath sounds clear to auscultation       Cardiovascular negative cardio ROS Normal cardiovascular exam Rhythm:Regular Rate:Normal     Neuro/Psych negative neurological ROS  negative psych ROS   GI/Hepatic Neg liver ROS, GERD  ,  Endo/Other  negative endocrine ROSdiabetes, Gestational  Renal/GU negative Renal ROS  negative genitourinary   Musculoskeletal negative musculoskeletal ROS (+)   Abdominal   Peds negative pediatric ROS (+)  Hematology negative hematology ROS (+)   Anesthesia Other Findings H/o PPH  LGA this pregnancy  Reproductive/Obstetrics negative OB ROS                            Anesthesia Physical Anesthesia Plan  ASA: III  Anesthesia Plan: Spinal   Post-op Pain Management:    Induction:   PONV Risk Score and Plan: Treatment may vary due to age or medical condition  Airway Management Planned: Natural Airway  Additional Equipment:   Intra-op Plan:   Post-operative Plan:   Informed Consent: I have reviewed the patients History and Physical, chart, labs and discussed the procedure including the risks, benefits and alternatives for the proposed anesthesia with the patient or authorized representative who has indicated his/her understanding and acceptance.   Dental advisory given  Plan Discussed with: CRNA  Anesthesia Plan Comments:         Anesthesia Quick Evaluation

## 2018-11-05 ENCOUNTER — Encounter (HOSPITAL_COMMUNITY): Payer: Self-pay | Admitting: Obstetrics and Gynecology

## 2018-11-05 ENCOUNTER — Other Ambulatory Visit: Payer: Self-pay

## 2018-11-05 LAB — CBC
HCT: 29.4 % — ABNORMAL LOW (ref 36.0–46.0)
HEMOGLOBIN: 9.1 g/dL — AB (ref 12.0–15.0)
MCH: 24.8 pg — AB (ref 26.0–34.0)
MCHC: 31 g/dL (ref 30.0–36.0)
MCV: 80.1 fL (ref 80.0–100.0)
NRBC: 0.4 % — AB (ref 0.0–0.2)
PLATELETS: 222 10*3/uL (ref 150–400)
RBC: 3.67 MIL/uL — AB (ref 3.87–5.11)
RDW: 13.9 % (ref 11.5–15.5)
WBC: 11.5 10*3/uL — AB (ref 4.0–10.5)

## 2018-11-05 MED ORDER — FERROUS SULFATE 325 (65 FE) MG PO TABS
325.0000 mg | ORAL_TABLET | Freq: Two times a day (BID) | ORAL | Status: DC
  Administered 2018-11-05 – 2018-11-06 (×2): 325 mg via ORAL
  Filled 2018-11-05 (×3): qty 1

## 2018-11-05 MED ORDER — MAGNESIUM OXIDE 400 (241.3 MG) MG PO TABS
400.0000 mg | ORAL_TABLET | Freq: Every day | ORAL | Status: DC
  Administered 2018-11-05: 400 mg via ORAL
  Filled 2018-11-05 (×3): qty 1

## 2018-11-05 MED ORDER — BUPIVACAINE IN DEXTROSE 0.75-8.25 % IT SOLN
INTRATHECAL | Status: DC | PRN
  Administered 2018-11-04: 1.6 mL via INTRATHECAL

## 2018-11-05 MED ORDER — ACETAMINOPHEN 325 MG PO TABS: 650.0000 mg | ORAL_TABLET | ORAL | Status: DC | PRN

## 2018-11-05 MED ORDER — ACETAMINOPHEN 325 MG PO TABS
650.0000 mg | ORAL_TABLET | ORAL | Status: DC | PRN
  Administered 2018-11-05 – 2018-11-06 (×4): 650 mg via ORAL
  Filled 2018-11-05 (×5): qty 2

## 2018-11-05 NOTE — Anesthesia Postprocedure Evaluation (Signed)
Anesthesia Post Note  Patient: Maria Prince  Procedure(s) Performed: Repeat CESAREAN SECTION (N/A Abdomen)     Patient location during evaluation: Mother Baby Anesthesia Type: Spinal Level of consciousness: oriented and awake and alert Pain management: pain level controlled Vital Signs Assessment: post-procedure vital signs reviewed and stable Respiratory status: spontaneous breathing and respiratory function stable Cardiovascular status: blood pressure returned to baseline and stable Postop Assessment: no headache, no backache and no apparent nausea or vomiting Anesthetic complications: no    Last Vitals:  Vitals:   11/05/18 0021 11/05/18 0530  BP: 101/68 100/73  Pulse: 68 67  Resp: 16 16  Temp: 36.5 C 36.6 C  SpO2:      Last Pain:  Vitals:   11/05/18 0530  TempSrc:   PainSc: 3    Pain Goal: Patients Stated Pain Goal: 6 (11/04/18 1307)               Junious Silk

## 2018-11-05 NOTE — Lactation Note (Signed)
This note was copied from a baby's chart. Lactation Consultation Note  Patient Name: Maria Prince [id number] Date: 11/05/2018 Reason for consult: Initial assessment  Baby is 25 hours old  LC reviewed and updated the doc flow sheets per mom / Yellow feeding diary sheet.  Baby has been consistent at the breast / voids and stools QS for age.  Mom is an experienced breast feeder of twins and initially were in NICU, and perm om got off  To a sluggish start and when on to breastfeed for 11 months and 17 months.  LC praised mom for her efforts.  LC reviewed the importance of STS until the baby can stay awake for a feeding, back to bith weight and gaining steadily.  Baby was fussy, LC checked diaper / dry, and assisted mom to latch on the right breast  Football/ multiple swallows noted and after 5 mins released/ nipple well rounded.  Baby fussy and seemed gasey. LC burped the baby and the baby wouldn't relatch on the right breast / football. LC asked mom if she wanted to try the cross cradle on the left .  Baby latched easily with several swallows, and was still feeding at 7 mins./per mom comfortable.  LC discussed nutritive vs non - nutritive feeding patterns and the importance of watching  The baby for hanging out latched. Also STS feedings would be beneficial.  Mother informed of post-discharge support and given phone number to the lactation department, including services for phone call assistance; out-patient appointments; and breastfeeding support group. List of other breastfeeding resources in the community given in the handout. Encouraged mother to call for problems or concerns related to breastfeeding.    Maternal Data Has patient been taught Hand Expression?: Yes Does the patient have breastfeeding experience prior to this delivery?: Yes  Feeding Feeding Type: Breast Fed  LATCH Score Latch: Grasps breast easily, tongue down, lips flanged, rhythmical sucking.  Audible Swallowing:  Spontaneous and intermittent  Type of Nipple: Everted at rest and after stimulation  Comfort (Breast/Nipple): Soft / non-tender  Hold (Positioning): Assistance needed to correctly position infant at breast and maintain latch.  LATCH Score: 9  Interventions Interventions: Breast feeding basics reviewed;Assisted with latch;Skin to skin;Breast massage;Breast compression;Adjust position;Support pillows;Position options  Lactation Tools Discussed/Used WIC Program: No   Consult Status Consult Status: Follow-up Date: 11/06/18 Follow-up type: In-patient    Matilde Sprang Tamu Golz 11/05/2018, 4:51 PM

## 2018-11-05 NOTE — Progress Notes (Signed)
POSTOPERATIVE DAY # 1 S/P Repeat LTCS, baby girl "[place]"    S:         Reports feeling much better than last time; recovery is going well             Tolerating po intake / no nausea / no vomiting / no flatus, but belching / no BM  Reports some flatus discomfort in shoulders  Concerned for developing pre-eclampsia due to hx   Denies dizziness, SOB, or CP             Bleeding is light             Pain controlled with Motrin             Up ad lib / ambulatory/ voiding QS  Newborn breast feeding - working on latching; no breast concerns    O:  VS: BP 113/71 (BP Location: Left Arm)   Pulse 71   Temp 98 F (36.7 C) (Oral)   Resp 18   Ht 5\' 10"  (1.778 m)   Wt 94.3 kg   SpO2 96%   BMI 29.83 kg/m    LABS:               Recent Labs    11/03/18 1000 11/05/18 0545  WBC 7.4 11.5*  HGB 10.1* 9.1*  PLT 240 222               Bloodtype: --/--/O POS (11/04 1003)  Rubella: Immune (04/08 0000)                                             I&O: Intake/Output      11/05 0701 - 11/06 0700 11/06 0701 - 11/07 0700   I.V. (mL/kg) 3300 (35)    Total Intake(mL/kg) 3300 (35)    Urine (mL/kg/hr) 650 800 (1.8)   Blood 243    Total Output 893 800   Net +2407 -800         Flu and Tdap UTD              Physical Exam:             Alert and Oriented X3  Lungs: Clear and unlabored  Heart: regular rate and rhythm / no murmurs  Abdomen: soft, non-tender, non-distended, active bowel sounds in all quadrants             Fundus: firm, non-tender, U-2             Dressing: honeycomb with steri strips - shadowing noted, but no active drainage              Incision:  approximated with sutures / no erythema / no ecchymosis / no drainage  Perineum: intact  Lochia: small, no clots  Extremities: +1 BLE edema, no calf pain or tenderness  A:        POD # 1 S/P Repeat LTCS            ABL Anemia compounding chronic IDA  Hx. Of Pre-eclampsia with last pregnancy   P:        Routine postoperative care            Ferrous sulfate 325mg  PO BID with meals  Magnesium oxide 400mg  PO daily   PEC s/s discussed   See lactation today  May shower today   Warm  liquids and ambulation to promote bowel motility   Continue current care   Carlean Jews, MSN, CNM [facility]

## 2018-11-05 NOTE — Anesthesia Procedure Notes (Signed)
Spinal  Patient location during procedure: OR Start time: 11/04/2018 2:48 PM End time: 11/04/2018 2:58 PM Staffing Anesthesiologist: Elmer Picker, MD Performed: anesthesiologist  Preanesthetic Checklist Completed: patient identified, surgical consent, pre-op evaluation, timeout performed, IV checked, risks and benefits discussed and monitors and equipment checked Spinal Block Patient position: sitting Prep: site prepped and draped and DuraPrep Patient monitoring: cardiac monitor, continuous pulse ox and blood pressure Approach: midline Location: L3-4 Injection technique: single-shot Needle Needle type: Pencan  Needle gauge: 24 G Needle length: 9 cm Assessment Sensory level: T6 Additional Notes Functioning IV was confirmed and monitors were applied. Sterile prep and drape, including hand hygiene and sterile gloves were used. The patient was positioned and the spine was prepped. The skin was anesthetized with lidocaine.  Free flow of clear CSF was obtained prior to injecting local anesthetic into the CSF.  The spinal needle aspirated freely following injection.  The needle was carefully withdrawn.  The patient tolerated the procedure well.

## 2018-11-05 NOTE — Anesthesia Postprocedure Evaluation (Signed)
Anesthesia Post Note  Patient: Maria Prince  Procedure(s) Performed: Repeat CESAREAN SECTION (N/A Abdomen)     Patient location during evaluation: PACU Anesthesia Type: Spinal Level of consciousness: oriented and awake and alert Pain management: pain level controlled Vital Signs Assessment: post-procedure vital signs reviewed and stable Respiratory status: spontaneous breathing, respiratory function stable and patient connected to nasal cannula oxygen Cardiovascular status: blood pressure returned to baseline and stable Postop Assessment: no headache, no backache and no apparent nausea or vomiting Anesthetic complications: no    Last Vitals:  Vitals:   11/05/18 1130 11/05/18 1325  BP:  110/72  Pulse:  78  Resp:  18  Temp:  36.7 C  SpO2: 97% 96%    Last Pain:  Vitals:   11/05/18 1325  TempSrc: Oral  PainSc: 0-No pain                 Shloime Keilman L Vina Byrd

## 2018-11-06 LAB — BIRTH TISSUE RECOVERY COLLECTION (PLACENTA DONATION)

## 2018-11-06 MED ORDER — OXYCODONE HCL 5 MG PO TABS: 5.0000 mg | ORAL_TABLET | ORAL | 0 refills | Status: AC | PRN

## 2018-11-06 MED ORDER — IBUPROFEN 600 MG PO TABS: 600.0000 mg | ORAL_TABLET | Freq: Four times a day (QID) | ORAL | 0 refills | Status: AC

## 2018-11-06 NOTE — Discharge Summary (Signed)
OB Discharge Summary  Patient Name: Maria Prince DOB: [birth year] MRN: [id number]  Date of admission: 11/04/2018 Delivering MD: COUSINS, SHERONETTE   Date of discharge: 11/06/2018  Admitting diagnosis: Previous Cesarean Section, Large For Gestational Age Baby Intrauterine pregnancy: [redacted]w[redacted]d     Secondary diagnosis:Principal Problem:   Postpartum care following cesarean delivery (11/5) Active Problems:   Previous cesarean section complicating pregnancy   S/P cesarean section  Additional problems: None     Discharge diagnosis: Term Pregnancy Delivered                                                                     Post partum procedures:None  Augmentation: None  Complications: None  Hospital course:  Sceduled C/S   32 y.o. yo G2P0102 at [redacted]w[redacted]d was admitted to the hospital 11/04/2018 for scheduled cesarean section with the following indication:Elective Repeat.  Membrane Rupture Time/Date: 3:21 PM ,11/04/2018   Patient delivered a Viable infant.11/04/2018  Details of operation can be found in separate operative note.  Pateint had an uncomplicated postpartum course.  She is ambulating, tolerating a regular diet, passing flatus, and urinating well. Patient is discharged home in stable condition on  11/06/18         Physical exam  Vitals:   11/05/18 1530 11/05/18 1645 11/05/18 2135 11/06/18 0500  BP:  107/73 111/79 117/78  Pulse:  76 77 61  Resp:  18 18 18   Temp:  98.1 F (36.7 C)  98.1 F (36.7 C)  TempSrc:  Oral  Oral  SpO2: 96%   97%  Weight:      Height:       General: alert, cooperative and no distress Lochia: appropriate Uterine Fundus: firm Incision: Healing well with no significant drainage DVT Evaluation: No evidence of DVT seen on physical exam. Labs: Lab Results  Component Value Date   WBC 11.5 (H) 11/05/2018   HGB 9.1 (L) 11/05/2018   HCT 29.4 (L) 11/05/2018   MCV 80.1 11/05/2018   PLT 222 11/05/2018   CMP Latest Ref Rng & Units 10/22/2016   Glucose 65 - 99 mg/dL 409(W)  BUN 6 - 20 mg/dL 10  Creatinine 1.19 - 1.47 mg/dL 8.29  Sodium 562 - 130 mmol/L 137  Potassium 3.5 - 5.1 mmol/L 4.2  Chloride 101 - 111 mmol/L 105  CO2 22 - 32 mmol/L 27  Calcium 8.9 - 10.3 mg/dL 7.3(L)  Total Protein 6.5 - 8.1 g/dL 4.7(L)  Total Bilirubin 0.3 - 1.2 mg/dL 0.7  Alkaline Phos 38 - 126 U/L 120  AST 15 - 41 U/L 51(H)  ALT 14 - 54 U/L 65(H)    Discharge instruction: per After Visit Summary and "Baby and Me Booklet".  After Visit Meds:  Allergies as of 11/06/2018      Reactions   Desonide Rash   Fluocinonide Rash   Hydrocortisone Butyrate Rash   Neomycin Rash   Tetracyclines & Related Rash   Pt states she is allergic to "tetracortisol"   Triamcinolone Rash      Medication List    STOP taking these medications   ADVANCED EVENING PRIMROSE OIL PO     TAKE these medications   acetaminophen 325 MG tablet Commonly known as:  TYLENOL Take  2 tablets (650 mg total) by mouth every 6 (six) hours as needed for mild pain or moderate pain.   calcium carbonate 500 MG chewable tablet Commonly known as:  TUMS - dosed in mg elemental calcium Chew 1-2 tablets by mouth at bedtime as needed for indigestion or heartburn.   doxylamine (Sleep) 25 MG tablet Commonly known as:  UNISOM Take 25 mg by mouth at bedtime.   ibuprofen 600 MG tablet Commonly known as:  ADVIL,MOTRIN Take 1 tablet (600 mg total) by mouth every 6 (six) hours.   oxyCODONE 5 MG immediate release tablet Commonly known as:  Oxy IR/ROXICODONE Take 1 tablet (5 mg total) by mouth every 4 (four) hours as needed (pain scale 4-7).   pantoprazole 40 MG tablet Commonly known as:  PROTONIX Take 40 mg by mouth daily.   prenatal multivitamin Tabs tablet Take 2 tablets by mouth daily at 2 PM.       Diet: routine diet  Activity: Advance as tolerated. Pelvic rest for 6 weeks.   Outpatient follow up:6 weeks Follow up Appt:No future appointments. Follow up visit: No follow-ups  on file.  Postpartum contraception: Not Discussed  Newborn Data: Live born female  Birth Weight: 9 lb 11.6 oz (4410 g) APGAR: 9, 10  Newborn Delivery   Birth date/time:  11/04/2018 15:22:00 Delivery type:  C-Section, Vacuum Assisted Trial of labor:  No C-section categorization:  Repeat     Baby Feeding: Unsure Disposition:home with mother   11/06/2018 Lendon Colonel, MD

## 2018-11-06 NOTE — Progress Notes (Signed)
POD# 1  S: Pt notes pain controlled w/ po meds, minimal lochia, nl void, out of bed w/o dizziness or chest pain, tol reg po, + flatus.   Vitals:   11/05/18 1530 11/05/18 1645 11/05/18 2135 11/06/18 0500  BP:  107/73 111/79 117/78  Pulse:  76 77 61  Resp:  18 18 18   Temp:  98.1 F (36.7 C)  98.1 F (36.7 C)  TempSrc:  Oral  Oral  SpO2: 96%   97%  Weight:      Height:        Gen: well appearing CV: RRR Pulm: CTAB Abd: soft, ND, approp tender, fundus below umbilicus, NT Inc: Dressed with blood soaked honeycomb dressing at the left angle.  Skin intact LE: tr edema, NT  CBC    Component Value Date/Time   WBC 11.5 (H) 11/05/2018 0545   RBC 3.67 (L) 11/05/2018 0545   HGB 9.1 (L) 11/05/2018 0545   HCT 29.4 (L) 11/05/2018 0545   PLT 222 11/05/2018 0545   MCV 80.1 11/05/2018 0545   MCH 24.8 (L) 11/05/2018 0545   MCHC 31.0 11/05/2018 0545   RDW 13.9 11/05/2018 0545    A/P: POD#  2 s/p repeat cesarean section - post-op. Doing well.  -Patient desiring discharge today.  Okay for discharge home after changing dressing -Anemia.  Iron at discharge -History of severe preeclampsia in prior pregnancy.  No evidence this pregnancy.  Precautions reviewed  Lendon Colonel 11/06/2018 8:54 AM

## 2018-11-06 NOTE — Lactation Note (Signed)
This note was copied from a baby's chart. Lactation Consultation Note  Patient Name: Maria Prince [id number] Date: 11/06/2018 Reason for consult: Follow-up assessment;Infant weight loss;Nipple pain/trauma(6% weight loss , )  Baby is 45 hours old  Per mom baby recently fed for 20 min's at 12 N.  Per mom breast are fuller and hearing more swallows.  Per mom mentioned the baby is obtaining the depth so much better compared to ' Yesterday/ and soreness is better.  Sore nipple and engorgement prevention and tx reviewed.  Mother informed of post-discharge support and given phone number to the lactation department, including services for phone call assistance; out-patient appointments; and breastfeeding support group. List of other breastfeeding resources in the community given in the handout. Encouraged mother to call for problems or concerns related to breastfeeding.  Per mom has a DEBP at home, and LC provided a hand pump, shells , and comfort gels.  Mother informed of post-discharge support and given phone number to the lactation department, including services for phone call assistance; out-patient appointments; and breastfeeding support group. List of other breastfeeding resources in the community given in the handout. Encouraged mother to call for problems or concerns related to breastfeeding.   Maternal Data Has patient been taught Hand Expression?: Yes  Feeding Feeding Type: (baby last fed at 12N per mom )  LATCH Score                   Interventions Interventions: Breast feeding basics reviewed;Hand pump;Shells;Comfort gels  Lactation Tools Discussed/Used Tools: Shells;Pump;Comfort gels Shell Type: Inverted Breast pump type: Manual Pump Review: Setup, frequency, and cleaning;Milk Storage Initiated by:: MAI  Date initiated:: 11/06/18   Consult Status Consult Status: Complete Date: 11/06/18    Kathrin Greathouse 11/06/2018, 12:45 PM

## 2018-12-17 DIAGNOSIS — Z130 Encounter for screening for diseases of the blood and blood-forming organs and certain disorders involving the immune mechanism: Secondary | ICD-10-CM | POA: Diagnosis not present

## 2019-01-29 ENCOUNTER — Encounter (HOSPITAL_COMMUNITY): Payer: Self-pay

## 2019-10-10 DIAGNOSIS — Z20828 Contact with and (suspected) exposure to other viral communicable diseases: Secondary | ICD-10-CM | POA: Diagnosis not present

## 2020-10-24 DIAGNOSIS — Z23 Encounter for immunization: Secondary | ICD-10-CM | POA: Diagnosis not present

## 2020-11-14 DIAGNOSIS — H9311 Tinnitus, right ear: Secondary | ICD-10-CM | POA: Diagnosis not present

## 2020-11-26 DIAGNOSIS — S93402A Sprain of unspecified ligament of left ankle, initial encounter: Secondary | ICD-10-CM | POA: Diagnosis not present

## 2020-11-26 DIAGNOSIS — M7989 Other specified soft tissue disorders: Secondary | ICD-10-CM | POA: Diagnosis not present

## 2020-11-26 DIAGNOSIS — S99912A Unspecified injury of left ankle, initial encounter: Secondary | ICD-10-CM | POA: Diagnosis not present

## 2020-11-26 DIAGNOSIS — M25572 Pain in left ankle and joints of left foot: Secondary | ICD-10-CM | POA: Diagnosis not present
